# Patient Record
Sex: Male | Born: 2000 | Race: White | Hispanic: No | Marital: Single | Smoking: Current every day smoker
Health system: Southern US, Community
[De-identification: ages and names within clinical notes are randomized; demographics above are authoritative.]

## PROBLEM LIST (undated history)

## (undated) DIAGNOSIS — J45909 Unspecified asthma, uncomplicated: Secondary | ICD-10-CM

## (undated) DIAGNOSIS — F32A Depression, unspecified: Secondary | ICD-10-CM

## (undated) DIAGNOSIS — Z803 Family history of malignant neoplasm of breast: Secondary | ICD-10-CM

## (undated) DIAGNOSIS — F329 Major depressive disorder, single episode, unspecified: Secondary | ICD-10-CM

## (undated) DIAGNOSIS — R51 Headache: Secondary | ICD-10-CM

## (undated) DIAGNOSIS — N189 Chronic kidney disease, unspecified: Secondary | ICD-10-CM

## (undated) DIAGNOSIS — N02B9 Other recurrent and persistent immunoglobulin A nephropathy: Secondary | ICD-10-CM

## (undated) DIAGNOSIS — K589 Irritable bowel syndrome without diarrhea: Secondary | ICD-10-CM

## (undated) DIAGNOSIS — N049 Nephrotic syndrome with unspecified morphologic changes: Secondary | ICD-10-CM

## (undated) HISTORY — DX: Unspecified asthma, uncomplicated: J45.909

## (undated) HISTORY — DX: Headache: R51

## (undated) HISTORY — DX: Chronic kidney disease, unspecified: N18.9

## (undated) HISTORY — PX: CIRCUMCISION: SHX1350

## (undated) HISTORY — DX: Depression, unspecified: F32.A

## (undated) HISTORY — DX: Family history of malignant neoplasm of breast: Z80.3

## (undated) HISTORY — DX: Other recurrent and persistent immunoglobulin A nephropathy: N02.B9

## (undated) HISTORY — DX: Nephrotic syndrome with unspecified morphologic changes: N04.9

## (undated) HISTORY — DX: Irritable bowel syndrome, unspecified: K58.9

## (undated) HISTORY — PX: COLONOSCOPY WITH ESOPHAGOGASTRODUODENOSCOPY (EGD): SHX5779

---
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
## 2021-02-05 NOTE — Progress Notes (Signed)
Complete Physical  Assessment and Plan: Health Maintenance- Discussed STD testing, safe sex, alcohol and drug awareness, drinking and driving dangers, wearing a seat belt and general safety measures for young adult. Clinton Jones was seen today for annual exam.  Diagnoses and all orders for this visit:  Encounter for general adult medical examination with abnormal findings Due Yearly  IgA nephropathy -     CBC with Differential/Platelet -     COMPLETE METABOLIC PANEL WITH GFR Continue to follow with nephrology  Bulimia nervosa Continue to follow with psychiatry Discussed diet and need for MVI and protein States he is currently eating breakfast and dinner, has not been binging and purging  Major depression, chronic -     buPROPion (WELLBUTRIN XL) 150 MG 24 hr tablet; Take 1 tablet (150 mg total) by mouth every morning. Instructed patient to contact office or on-call physician promptly should condition worsen or any new symptoms appear. IF THE PATIENT HAS ANY SUICIDAL OR HOMICIDAL IDEATIONS, CALL THE OFFICE, DISCUSS WITH A SUPPORT MEMBER, OR GO TO THE ER IMMEDIATELY. Patient was agreeable with this plan.  Continue to follow with psychiatry  Migraine without aura and without status migrainosus, not intractable Currently well controlled  Nicotine dependence due to vaping tobacco product Counseled on the dangers of vaping and stressed the importance of quitting.  He is not currently ready to quit.   Screening for hematuria or proteinuria -     Urinalysis, Routine w reflex microscopic -     Microalbumin / creatinine urine ratio  Screening for thyroid disorder -     TSH  Screening, lipid -     Lipid panel  Screening for diabetes mellitus -     Hemoglobin A1c  Vitamin D deficiency -     VITAMIN D 25 Hydroxy (Vit-D Deficiency, Fractures)  Medication management -     Magnesium  Screening for STD (sexually transmitted disease) -     C. trachomatis/N. gonorrhoeae RNA -     HIV  Antibody (routine testing w rflx) -     RPR  Flu vaccine need -     Flu Vaccine QUAD 6+ mos PF IM (Fluarix Quad PF)     Discussed med's effects and SE's. Screening labs and tests as requested with regular follow-up as recommended. Over 40 minutes of exam, counseling, chart review and critical decision making was performed   HPI  This very nice 20 y.o.male presents for complete physical.  Patient has no major health issues.  Patient reports no complaints at this time.  BMI is Body mass index is 17.88 kg/m., he has been working on diet and exercise. Wt Readings from Last 3 Encounters:  02/10/21 115 lb (52.2 kg) (2 %, Z= -2.11)*  11/07/20 118 lb 6.4 oz (53.7 kg) (3 %, Z= -1.83)*  07/10/19 115 lb 11.9 oz (52.5 kg) (5 %, Z= -1.69)*   * Growth percentiles are based on CDC (Boys, 2-20 Years) data.   He does note that his depression does seem to be slightly worse currently. Currently on Zoloft and symptoms are not controlled.  Has tried Prozac and Lexapro in the past. He used Lamictal in the past with no results. He does not find joy in things, states he is having suicidal thoughts but does not have a plan. He has an upcoming appointment with psychiatry on 02/20/21.  He does not workout.  Finally, patient has history of Vitamin D Deficiency and last vitamin D was No results found for: VD25OH.  Currently  on supplementation  Current Medications:  Current Outpatient Medications on File Prior to Visit  Medication Sig Dispense Refill   Ascorbic Acid (VITAMIN C) 1000 MG tablet Take 1,000 mg by mouth daily.     FIBER ADULT GUMMIES PO Take by mouth. Takes 2 a day     imipramine (TOFRANIL) 25 MG tablet Take 25 mg by mouth at bedtime. Takes 25 mg BID     Omega-3 Fatty Acids (FISH OIL EXTRA STRENGTH) 1200 MG CAPS Take by mouth.     No current facility-administered medications on file prior to visit.   Health Maintenance:   Immunization History  Administered Date(s) Administered   DTaP  11/17/2001, 01/10/2002, 03/14/2002, 12/25/2002, 09/23/2005   Hepatitis A 11/06/2004, 09/23/2005   Hepatitis B June 16, 2001, 10/17/2001, 06/19/2002   HiB (PRP-OMP) 11/17/2001, 01/10/2002, 03/14/2002, 09/13/2002   IPV 11/17/2001, 01/10/2002, 12/25/2002, 09/23/2005   Influenza-Unspecified 11/30/2011, 01/03/2014, 12/09/2018   MMR 09/13/2002, 09/23/2005   MenQuadfi_Meningococcal Groups ACYW Conjugate 07/18/2013, 08/05/2018   Meningococcal B, OMV 08/05/2018, 10/30/2020   Pneumococcal-Unspecified 11/17/2001, 01/10/2002, 03/14/2002, 11/22/2002   Tdap 05/20/2012   Varicella 12/25/2002, 09/23/2005    TD/TDAP: Influenza:02/10/21 Pneumovax:  HPV vaccines:  Sexually Active: yes STD testing offered Last Dental Exam: Last Eye Exam:  Allergies:  Allergies  Allergen Reactions   Morphine And Related Hives, Nausea And Vomiting and Rash   Medical History:  has Migraine with aura, without mention of intractable migraine without mention of status migrainosus; Tension headache; Migraine without aura and without status migrainosus, not intractable; Mild recurrent major depression ([place]); Cannabis use disorder, moderate, dependence ([place]); Nicotine dependence due to vaping tobacco product; Abdominal pain, chronic, generalized; Abnormal weight loss; IgA nephropathy; Irritable bowel syndrome with constipation and diarrhea; Recurrent oral ulcers; and Bulimia nervosa on their problem list. Surgical History:  He  has a past surgical history that includes Tympanostomy (Bilateral, 2004); Adenoidectomy (Bilateral, 2004); and Circumcision (2003). Family History:  Hisfamily history includes Allergic rhinitis in his brother, father, and mother; Asthma in his brother; Depression in his father and paternal grandfather; Heart attack in his maternal grandfather and maternal grandmother; Hypertension in his father, maternal grandfather, and maternal grandmother; Migraines in his mother; Nephrotic syndrome in his father;  Pancreatic cancer in his maternal grandfather. Social History:   reports that he has been smoking e-cigarettes. He started smoking about 4 years ago. He uses smokeless tobacco. He reports current drug use. Drug: Marijuana. He reports that he does not drink alcohol.  Review of Systems: Review of Systems  Constitutional:  Negative for chills and fever.  HENT:  Negative for congestion, hearing loss, sinus pain, sore throat and tinnitus.   Eyes:  Negative for blurred vision and double vision.  Respiratory:  Negative for cough, hemoptysis, sputum production, shortness of breath and wheezing.   Cardiovascular:  Negative for chest pain, palpitations and leg swelling.  Gastrointestinal:  Negative for abdominal pain, constipation, diarrhea, heartburn, nausea and vomiting.  Genitourinary:  Negative for dysuria, frequency, hematuria and urgency.  Musculoskeletal:  Negative for back pain, falls, joint pain, myalgias and neck pain.  Skin:  Negative for rash.  Neurological:  Negative for dizziness, tingling, tremors, weakness and headaches.  Endo/Heme/Allergies:  Does not bruise/bleed easily.  Psychiatric/Behavioral:  Positive for depression and suicidal ideas. The patient is not nervous/anxious and does not have insomnia.    Physical Exam: Estimated body mass index is 18.54 kg/m as calculated from the following:   Height as of 11/21/19: $RemoveBef'5\' 7"'RcZOLssWpL$  (1.702 m).   Weight as of 11/07/20:  118 lb 6.4 oz (53.7 kg). There were no vitals taken for this visit. General Appearance: very pleasant thin male, in no apparent distress.  Eyes: PERRLA, EOMs, conjunctiva no swelling or erythema, normal fundi and vessels.  Sinuses: No Frontal/maxillary tenderness  ENT/Mouth: Ext aud canals clear, normal light reflex with TMs without erythema, bulging. Good dentition. No erythema, swelling, or exudate on post pharynx. Tonsils not swollen or erythematous. Hearing normal.  Neck: Supple, thyroid normal. No bruits  Respiratory:  Respiratory effort normal, BS equal bilaterally without rales, rhonchi, wheezing or stridor.  Cardio: RRR without murmurs, rubs or gallops. Brisk peripheral pulses without edema.  Chest: symmetric, with normal excursions and percussion.  Abdomen: Soft, nontender, no guarding, rebound, hernias, masses, or organomegaly.  Lymphatics: Non tender without lymphadenopathy.  Genitourinary: defer Musculoskeletal: Full ROM all peripheral extremities,5/5 strength, and normal gait.  Skin: Warm, dry without rashes, lesions, ecchymosis. Neuro: Cranial nerves intact, reflexes equal bilaterally. Normal muscle tone, no cerebellar symptoms. Sensation intact.  Psych: Awake and oriented X 3, normal affect, Insight and Judgment appropriate.   EKG: defer  Clinton Jones 12:36 PM [facility]

## 2021-02-10 ENCOUNTER — Ambulatory Visit (INDEPENDENT_AMBULATORY_CARE_PROVIDER_SITE_OTHER): Payer: BC Managed Care – PPO | Admitting: Nurse Practitioner

## 2021-02-10 ENCOUNTER — Encounter: Payer: Self-pay | Admitting: Nurse Practitioner

## 2021-02-10 ENCOUNTER — Other Ambulatory Visit: Payer: Self-pay

## 2021-02-10 VITALS — BP 98/66 | HR 99 | Temp 97.7°F | Ht 67.25 in | Wt 115.0 lb

## 2021-02-10 DIAGNOSIS — N028 Recurrent and persistent hematuria with other morphologic changes: Secondary | ICD-10-CM

## 2021-02-10 DIAGNOSIS — Z0000 Encounter for general adult medical examination without abnormal findings: Secondary | ICD-10-CM | POA: Diagnosis not present

## 2021-02-10 DIAGNOSIS — Z1389 Encounter for screening for other disorder: Secondary | ICD-10-CM

## 2021-02-10 DIAGNOSIS — G43009 Migraine without aura, not intractable, without status migrainosus: Secondary | ICD-10-CM

## 2021-02-10 DIAGNOSIS — Z131 Encounter for screening for diabetes mellitus: Secondary | ICD-10-CM

## 2021-02-10 DIAGNOSIS — Z113 Encounter for screening for infections with a predominantly sexual mode of transmission: Secondary | ICD-10-CM | POA: Diagnosis not present

## 2021-02-10 DIAGNOSIS — Z23 Encounter for immunization: Secondary | ICD-10-CM | POA: Diagnosis not present

## 2021-02-10 DIAGNOSIS — Z13220 Encounter for screening for lipoid disorders: Secondary | ICD-10-CM

## 2021-02-10 DIAGNOSIS — F17290 Nicotine dependence, other tobacco product, uncomplicated: Secondary | ICD-10-CM

## 2021-02-10 DIAGNOSIS — Z0001 Encounter for general adult medical examination with abnormal findings: Secondary | ICD-10-CM

## 2021-02-10 DIAGNOSIS — F502 Bulimia nervosa: Secondary | ICD-10-CM

## 2021-02-10 DIAGNOSIS — E559 Vitamin D deficiency, unspecified: Secondary | ICD-10-CM

## 2021-02-10 DIAGNOSIS — F330 Major depressive disorder, recurrent, mild: Secondary | ICD-10-CM

## 2021-02-10 DIAGNOSIS — Z1329 Encounter for screening for other suspected endocrine disorder: Secondary | ICD-10-CM

## 2021-02-10 DIAGNOSIS — Z79899 Other long term (current) drug therapy: Secondary | ICD-10-CM

## 2021-02-10 DIAGNOSIS — F329 Major depressive disorder, single episode, unspecified: Secondary | ICD-10-CM

## 2021-02-10 MED ORDER — BUPROPION HCL ER (XL) 150 MG PO TB24: 150.0000 mg | ORAL_TABLET | ORAL | 2 refills | Status: DC

## 2021-02-10 NOTE — Patient Instructions (Addendum)
Bupropion Extended-Release Tablets (Depression/Mood Disorders) What is this medication? BUPROPION (byoo PROE pee on) treats depression. It increases norepinephrine and dopamine in the brain, hormones that help regulate mood. It belongs to a group of medications called NDRIs. This medicine may be used for other purposes; ask your health care provider or pharmacist if you have questions. COMMON BRAND NAME(S): Aplenzin, Budeprion XL, Forfivo XL, Wellbutrin XL What should I tell my care team before I take this medication? They need to know if you have any of these conditions: An eating disorder, such as anorexia or bulimia Bipolar disorder or psychosis Diabetes or high blood sugar, treated with medication Glaucoma Head injury or brain tumor Heart disease, previous heart attack, or irregular heart beat High blood pressure Kidney or liver disease Seizures (convulsions) Suicidal thoughts or a previous suicide attempt Tourette's syndrome Weight loss An unusual or allergic reaction to bupropion, other medications, foods, dyes, or preservatives Pregnant or trying to become pregnant Breast-feeding How should I use this medication? Take this medication by mouth with a glass of water. Follow the directions on the prescription label. You can take it with or without food. If it upsets your stomach, take it with food. Do not crush, chew, or cut these tablets. This medication is taken once daily at the same time each day. Do not take your medication more often than directed. Do not stop taking this medication suddenly except upon the advice of your care team. Stopping this medication too quickly may cause serious side effects or your condition may worsen. A special MedGuide will be given to you by the pharmacist with each prescription and refill. Be sure to read this information carefully each time. Talk to your care team about the use of this medication in children. Special care may be needed. Overdosage:  If you think you have taken too much of this medicine contact a poison control center or emergency room at once. NOTE: This medicine is only for you. Do not share this medicine with others. What if I miss a dose? If you miss a dose, skip the missed dose and take your next tablet at the regular time. Do not take double or extra doses. What may interact with this medication? Do not take this medication with any of the following: Linezolid MAOIs like Azilect, Carbex, Eldepryl, Marplan, Nardil, and Parnate Methylene blue (injected into a vein) Other medications that contain bupropion like Zyban This medication may also interact with the following: Alcohol Certain medications for anxiety or sleep Certain medications for blood pressure like metoprolol, propranolol Certain medications for depression or psychotic disturbances Certain medications for HIV or AIDS like efavirenz, lopinavir, nelfinavir, ritonavir Certain medications for irregular heart beat like propafenone, flecainide Certain medications for Parkinson's disease like amantadine, levodopa Certain medications for seizures like carbamazepine, phenytoin, phenobarbital Cimetidine Clopidogrel Cyclophosphamide Digoxin Furazolidone Isoniazid Nicotine Orphenadrine Procarbazine Steroid medications like prednisone or cortisone Stimulant medications for attention disorders, weight loss, or to stay awake Tamoxifen Theophylline Thiotepa Ticlopidine Tramadol Warfarin This list may not describe all possible interactions. Give your health care provider a list of all the medicines, herbs, non-prescription drugs, or dietary supplements you use. Also tell them if you smoke, drink alcohol, or use illegal drugs. Some items may interact with your medicine. What should I watch for while using this medication? Tell your care team if your symptoms do not get better or if they get worse. Visit your care team for regular checks on your progress.  Because it may take several weeks  to see the full effects of this medication, it is important to continue your treatment as prescribed. Watch for new or worsening thoughts of suicide or depression. This includes sudden changes in mood, behavior, or thoughts. These changes can happen at any time but are more common in the beginning of treatment or after a change in dose. Call your care team right away if you experience these thoughts or worsening depression. Manic episodes may happen in patients with bipolar disorder who take this medication. Watch for changes in feelings or behaviors such as feeling anxious, nervous, agitated, panicky, irritable, hostile, aggressive, impulsive, severely restless, overly excited and hyperactive, or trouble sleeping. These symptoms can happen at anytime but are more common in the beginning of treatment or after a change in dose. Call your care team right away if you notice any of these symptoms. This medication may cause serious skin reactions. They can happen weeks to months after starting the medication. Contact your care team right away if you notice fevers or flu-like symptoms with a rash. The rash may be red or purple and then turn into blisters or peeling of the skin. Or, you might notice a red rash with swelling of the face, lips or lymph nodes in your neck or under your arms. Avoid drinks that contain alcohol while taking this medication. Drinking large amounts of alcohol, using sleeping or anxiety medications, or quickly stopping the use of these agents while taking this medication may increase your risk for a seizure. Do not drive or use heavy machinery until you know how this medication affects you. This medication can impair your ability to perform these tasks. Do not take this medication close to bedtime. It may prevent you from sleeping. Your mouth may get dry. Chewing sugarless gum or sucking hard candy, and drinking plenty of water may help. Contact your care  team if the problem does not go away or is severe. The tablet shell for some brands of this medication does not dissolve. This is normal. The tablet shell may appear whole in the stool. This is not a cause for concern. What side effects may I notice from receiving this medication? Side effects that you should report to your care team as soon as possible: Allergic reactions--skin rash, itching, hives, swelling of the face, lips, tongue, or throat Increase in blood pressure Mood and behavior changes--anxiety, nervousness, confusion, hallucinations, irritability, hostility, thoughts of suicide or self-harm, worsening mood, feelings of depression Redness, blistering, peeling, or loosening of the skin, including inside the mouth Seizures Sudden eye pain or change in vision such as blurry vision, seeing halos around lights, vision loss Side effects that usually do not require medical attention (report to your care team if they continue or are bothersome): Constipation Dizziness Dry mouth Loss of appetite Nausea Tremors or shaking Trouble sleeping This list may not describe all possible side effects. Call your doctor for medical advice about side effects. You may report side effects to FDA at 1-800-FDA-1088. Where should I keep my medication? Keep out of the reach of children and pets. Store at room temperature between 15 and 30 degrees C (59 and 86 degrees F). Throw away any unused medication after the expiration date. NOTE: This sheet is a summary. It may not cover all possible information. If you have questions about this medicine, talk to your doctor, pharmacist, or health care provider.  2022 Elsevier/Gold Standard (2020-04-24 00:00:00)  Major Depressive Disorder, Adult Major depressive disorder (MDD) is a mental health condition. It  may also be called clinical depression or unipolar depression. MDD causes symptoms of sadness, hopelessness, and loss of interest in things. These symptoms last  most of the day, almost every day, for 2 weeks. MDD can also cause physical symptoms. It can interfere with relationships and with everyday activities, such as work, school, and activities that are usually pleasant. MDD may be mild, moderate, or severe. It may be single-episode MDD, which happens once, or recurrent MDD, which may occur multiple times. What are the causes? The exact cause of this condition is not known. MDD is most likely caused by a combination of things, which may include: Your personality traits. Learned or conditioned behaviors or thoughts or feelings that reinforce negativity. Any alcohol or substance misuse. Long-term (chronic) physical or mental health illness. Going through a traumatic experience or major life changes. What increases the risk? The following factors may make someone more likely to develop MDD: A family history of depression. Being a woman. Troubled family relationships. Abnormally low levels of certain brain chemicals. Traumatic or painful events in childhood, especially abuse or loss of a parent. A lot of stress from life experiences, such as poor living conditions or discrimination. Chronic physical illness or other mental health disorders. What are the signs or symptoms? The main symptoms of MDD usually include: Constant depressed or irritable mood. A loss of interest in things and activities. Other symptoms include: Sleeping or eating too much or too little. Unexplained weight gain or weight loss. Tiredness or low energy. Being agitated, restless, or weak. Feeling hopeless, worthless, or guilty. Trouble thinking clearly or making decisions. Thoughts of suicide or thoughts of harming others. Isolating oneself or avoiding other people or activities. Trouble completing tasks, work, or any normal obligations. Severe symptoms of this condition may include: Psychotic depression.This may include false beliefs, or delusions. It may also include  seeing, hearing, tasting, smelling, or feeling things that are not real (hallucinations). Chronic depression or persistent depressive disorder. This is low-level depression that lasts for at least 2 years. Melancholic depression, or feeling extremely sad and hopeless. Catatonic depression, which includes trouble speaking and trouble moving. How is this diagnosed? This condition may be diagnosed based on: Your symptoms. Your medical and mental health history. You may be asked questions about your lifestyle, including any drug and alcohol use. A physical exam. Blood tests to rule out other conditions. MDD is confirmed if you have the following symptoms most of the day, nearly every day, in a 2-week period: Either a depressed mood or loss of interest. At least four other MDD symptoms. How is this treated? This condition is usually treated by mental health professionals, such as psychologists, psychiatrists, and clinical social workers. You may need more than one type of treatment. Treatment may include: Psychotherapy, also called talk therapy or counseling. Types of psychotherapy include: Cognitive behavioral therapy (CBT). This teaches you to recognize unhealthy feelings, thoughts, and behaviors, and replace them with positive thoughts and actions. Interpersonal therapy (IPT). This helps you to improve the way you communicate with others or relate to them. Family therapy. This treatment includes members of your family. Medicines to treat anxiety and depression. These medicines help to balance the brain chemicals that affect your emotions. Lifestyle changes. You may be asked to: Limit alcohol use and avoid drug use. Get regular exercise. Get plenty of sleep. Make healthy eating choices. Spend more time outdoors. Brain stimulation. This may be done if symptoms are very severe and other treatments have not worked. Examples  of this treatment are electroconvulsive therapy and transcranial magnetic  stimulation. Follow these instructions at home: Activity Exercise regularly and spend time outdoors. Find activities that you enjoy doing, and make time to do them. Find healthy ways to manage stress, such as: Meditation or deep breathing. Spending time in nature. Journaling. Return to your normal activities as told by your health care provider. Ask your health care provider what activities are safe for you. Alcohol and drug use If you drink alcohol: Limit how much you use to: 0-1 drink a day for women who are not pregnant. 0-2 drinks a day for men. Be aware of how much alcohol is in your drink. In the [place] one drink equals one 12 oz bottle of beer (355 mL), one 5 oz glass of wine (148 mL), or one 1 oz glass of hard liquor (44 mL). Discuss your alcohol use with your health care provider. Alcohol can affect any antidepressant medicines you are taking. Discuss any drug use with your health care provider. General instructions  Take over-the-counter and prescription medicines only as told by your health care provider. Eat a healthy diet and get plenty of sleep. Consider joining a support group. Your health care provider may be able to recommend one. Keep all follow-up visits as told by your health care provider. This is important. Where to find more information [employer] on Mental Illness: www.nami.org [place] [employer] of Mental Health: http://www.maynard.net/ Contact a health care provider if: Your symptoms get worse. You develop new symptoms. Get help right away if: You self-harm. You have serious thoughts about hurting yourself or others. You hallucinate. If you ever feel like you may hurt yourself or others, or have thoughts about taking your own life, get help right away. Go to your nearest emergency department or: Call your local emergency services (911 in the [place] Call a suicide crisis helpline, such as the National Suicide Prevention Lifeline at 626-204-4982 or  988 in the [place] This is open 24 hours a day in the [place] Text the Crisis Text Line at 325-713-1808 (in the [place] Summary Major depressive disorder (MDD) is a mental health condition. MDD causes symptoms of sadness, hopelessness, and loss of interest in things. These symptoms last most of the day, almost every day, for 2 weeks. The symptoms of MDD can interfere with relationships and with everyday activities. Treatments and support are available for people who develop MDD. You may need more than one type of treatment. Get help right away if you have serious thoughts about hurting yourself or others. This information is not intended to replace advice given to you by your health care provider. Make sure you discuss any questions you have with your health care provider. Document Revised: 09/04/2020 Document Reviewed: 01/21/2019 Elsevier Patient Education  2022 [employer].

## 2021-02-11 LAB — COMPLETE METABOLIC PANEL WITH GFR
AG Ratio: 1.8 (calc) (ref 1.0–2.5)
ALT: 7 U/L — ABNORMAL LOW (ref 8–46)
AST: 12 U/L (ref 12–32)
Albumin: 4.2 g/dL (ref 3.6–5.1)
Alkaline phosphatase (APISO): 132 U/L (ref 46–169)
BUN: 9 mg/dL (ref 7–20)
CO2: 30 mmol/L (ref 20–32)
Calcium: 9.4 mg/dL (ref 8.9–10.4)
Chloride: 102 mmol/L (ref 98–110)
Creat: 0.86 mg/dL (ref 0.60–1.24)
Globulin: 2.4 g/dL (calc) (ref 2.1–3.5)
Glucose, Bld: 80 mg/dL (ref 65–99)
Potassium: 4.5 mmol/L (ref 3.8–5.1)
Sodium: 139 mmol/L (ref 135–146)
Total Bilirubin: 0.4 mg/dL (ref 0.2–1.1)
Total Protein: 6.6 g/dL (ref 6.3–8.2)
eGFR: 128 mL/min/{1.73_m2} (ref >=60)

## 2021-02-11 LAB — HEMOGLOBIN A1C
Hgb A1c MFr Bld: 4.7 % of total Hgb (ref <5.7)
Mean Plasma Glucose: 88 mg/dL
eAG (mmol/L): 4.9 mmol/L

## 2021-02-11 LAB — MAGNESIUM: Magnesium: 2.1 mg/dL (ref 1.5–2.5)

## 2021-02-11 LAB — URINALYSIS, ROUTINE W REFLEX MICROSCOPIC
Bacteria, UA: NONE SEEN /HPF
Bilirubin Urine: NEGATIVE
Glucose, UA: NEGATIVE
Hyaline Cast: NONE SEEN /LPF
Ketones, ur: NEGATIVE
Nitrite: NEGATIVE
Specific Gravity, Urine: 1.014 (ref 1.001–1.035)
Squamous Epithelial / HPF: NONE SEEN /HPF (ref <=5)
pH: 5.5 (ref 5.0–8.0)

## 2021-02-11 LAB — MICROALBUMIN / CREATININE URINE RATIO
Creatinine, Urine: 114 mg/dL (ref 20–320)
Microalb Creat Ratio: 130 mcg/mg creat — ABNORMAL HIGH (ref <30)
Microalb, Ur: 14.8 mg/dL

## 2021-02-11 LAB — CBC WITH DIFFERENTIAL/PLATELET
Absolute Monocytes: 462 cells/uL (ref 200–950)
Basophils Absolute: 21 cells/uL (ref 0–200)
Basophils Relative: 0.3 %
Eosinophils Absolute: 91 cells/uL (ref 15–500)
Eosinophils Relative: 1.3 %
HCT: 41.5 % (ref 38.5–50.0)
Hemoglobin: 14.5 g/dL (ref 13.2–17.1)
Lymphs Abs: 1680 cells/uL (ref 850–3900)
MCH: 33.6 pg — ABNORMAL HIGH (ref 27.0–33.0)
MCHC: 34.9 g/dL (ref 32.0–36.0)
MCV: 96.1 fL (ref 80.0–100.0)
MPV: 11.2 fL (ref 7.5–12.5)
Monocytes Relative: 6.6 %
Neutro Abs: 4746 cells/uL (ref 1500–7800)
Neutrophils Relative %: 67.8 %
Platelets: 270 10*3/uL (ref 140–400)
RBC: 4.32 10*6/uL (ref 4.20–5.80)
RDW: 12.3 % (ref 11.0–15.0)
Total Lymphocyte: 24 %
WBC: 7 10*3/uL (ref 3.8–10.8)

## 2021-02-11 LAB — LIPID PANEL
Cholesterol: 131 mg/dL (ref <170)
HDL: 52 mg/dL (ref >45)
LDL Cholesterol (Calc): 64 mg/dL (calc) (ref <110)
Non-HDL Cholesterol (Calc): 79 mg/dL (calc) (ref <120)
Total CHOL/HDL Ratio: 2.5 (calc) (ref <5.0)
Triglycerides: 73 mg/dL (ref <90)

## 2021-02-11 LAB — MICROSCOPIC MESSAGE

## 2021-02-11 LAB — RPR: RPR Ser Ql: NONREACTIVE

## 2021-02-11 LAB — HIV ANTIBODY (ROUTINE TESTING W REFLEX): HIV 1&2 Ab, 4th Generation: NONREACTIVE

## 2021-02-11 LAB — VITAMIN D 25 HYDROXY (VIT D DEFICIENCY, FRACTURES): Vit D, 25-Hydroxy: 25 ng/mL — ABNORMAL LOW (ref 30–100)

## 2021-02-11 LAB — TSH: TSH: 1.42 mIU/L (ref 0.50–4.30)

## 2021-02-13 ENCOUNTER — Other Ambulatory Visit: Payer: Self-pay | Admitting: Nurse Practitioner

## 2021-02-13 DIAGNOSIS — Z113 Encounter for screening for infections with a predominantly sexual mode of transmission: Secondary | ICD-10-CM

## 2021-02-18 ENCOUNTER — Other Ambulatory Visit: Payer: Self-pay

## 2021-02-18 ENCOUNTER — Other Ambulatory Visit: Payer: BC Managed Care – PPO

## 2021-02-18 DIAGNOSIS — Z113 Encounter for screening for infections with a predominantly sexual mode of transmission: Secondary | ICD-10-CM | POA: Diagnosis not present

## 2021-02-19 LAB — C. TRACHOMATIS/N. GONORRHOEAE RNA
C. trachomatis RNA, TMA: NOT DETECTED
N. gonorrhoeae RNA, TMA: NOT DETECTED

## 2021-03-10 ENCOUNTER — Encounter: Payer: Self-pay | Admitting: Nurse Practitioner

## 2021-03-10 ENCOUNTER — Other Ambulatory Visit: Payer: Self-pay | Admitting: Nurse Practitioner

## 2021-03-10 DIAGNOSIS — M25519 Pain in unspecified shoulder: Secondary | ICD-10-CM

## 2021-03-10 DIAGNOSIS — M25512 Pain in left shoulder: Secondary | ICD-10-CM

## 2021-03-10 MED ORDER — MELOXICAM 7.5 MG PO TABS: 7.5000 mg | ORAL_TABLET | Freq: Every day | ORAL | 2 refills | Status: DC

## 2021-03-11 ENCOUNTER — Ambulatory Visit
Admission: RE | Admit: 2021-03-11 | Discharge: 2021-03-11 | Disposition: A | Discharge status: Home / Self Care | Payer: Self-pay | Source: Ambulatory Visit | Attending: Nurse Practitioner | Admitting: Nurse Practitioner

## 2021-03-11 ENCOUNTER — Other Ambulatory Visit: Payer: Self-pay

## 2021-03-11 DIAGNOSIS — M25512 Pain in left shoulder: Secondary | ICD-10-CM

## 2021-03-20 ENCOUNTER — Other Ambulatory Visit: Payer: Self-pay | Admitting: Nurse Practitioner

## 2021-03-20 DIAGNOSIS — M25512 Pain in left shoulder: Secondary | ICD-10-CM

## 2021-03-23 ENCOUNTER — Encounter: Payer: Self-pay | Admitting: Nurse Practitioner

## 2021-04-01 ENCOUNTER — Encounter: Payer: Self-pay | Admitting: Nurse Practitioner

## 2021-04-01 ENCOUNTER — Ambulatory Visit (INDEPENDENT_AMBULATORY_CARE_PROVIDER_SITE_OTHER): Payer: BC Managed Care – PPO | Admitting: Nurse Practitioner

## 2021-04-01 ENCOUNTER — Other Ambulatory Visit: Payer: Self-pay

## 2021-04-01 VITALS — BP 98/62 | HR 133 | Temp 97.7°F | Wt 119.0 lb

## 2021-04-01 DIAGNOSIS — F17290 Nicotine dependence, other tobacco product, uncomplicated: Secondary | ICD-10-CM | POA: Diagnosis not present

## 2021-04-01 DIAGNOSIS — R300 Dysuria: Secondary | ICD-10-CM

## 2021-04-01 DIAGNOSIS — F329 Major depressive disorder, single episode, unspecified: Secondary | ICD-10-CM

## 2021-04-01 DIAGNOSIS — R369 Urethral discharge, unspecified: Secondary | ICD-10-CM | POA: Diagnosis not present

## 2021-04-01 MED ORDER — CIPROFLOXACIN HCL 500 MG PO TABS
500.0000 mg | ORAL_TABLET | Freq: Two times a day (BID) | ORAL | 0 refills | Status: AC
Start: 2021-04-01 — End: 2021-04-11

## 2021-04-01 NOTE — Progress Notes (Signed)
Assessment and Plan:  Clinton Jones was seen today for acute visit.  Diagnoses and all orders for this visit:  Dysuria -     ciprofloxacin (CIPRO) 500 MG tablet; Take 1 tablet (500 mg total) by mouth 2 (two) times daily for 10 days. -     C. trachomatis/N. gonorrhoeae RNA -     Urine Culture -     Urinalysis, Routine w reflex microscopic Push fluids If develop nausea, vomiting, fever or severe back pain go to the ER  Penile discharge -     ciprofloxacin (CIPRO) 500 MG tablet; Take 1 tablet (500 mg total) by mouth 2 (two) times daily for 10 days. -     C. trachomatis/N. 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 Discussed med's effects and SE's.   Over 20 minutes of exam, counseling, chart review, and critical decision making was performed.   Future Appointments  Date Time Provider Department Center  04/01/2021  4:15 PM Revonda Humphrey, NP [facility] None  02/10/2022  3:00 PM Revonda Humphrey, NP [facility] None    ------------------------------------------------------------------------------------------------------------------   HPI BP 98/62    Pulse (!) 133    Temp 97.7 F (36.5 C)    Wt 119 lb (54 kg)    SpO2 97%    BMI 18.50 kg/m   20 y.o.male presents for burning on urination and yellow penile discharge which began 2 days ago. No new sexual partner. Same sex partner is asymptomatic.   BMI is Body mass index is 18.5 kg/m., he has been working on diet and exercise. Wt Readings from Last 3 Encounters:  04/01/21 119 lb (54 kg) (3 %, Z= -1.85)*  02/10/21 115 lb (52.2 kg) (2 %, Z= -2.11)*  11/07/20 118 lb 6.4 oz (53.7 kg) (3 %, Z= -1.83)*   * Growth percentiles are based on CDC (Boys, 2-20 Years) data.   He is on a new medication for  depression- Zyprexa 7.5mg . He started this medication approximately 1 month ago.  He has stopped vaping, not having withdrawal symptoms  Past Medical History:  Diagnosis Date   Asthma    Chronic kidney disease    Depression    Headache(784.0)    IBS (irritable bowel syndrome)    IgA nephropathy with nephrotic syndrome      Allergies  Allergen Reactions   Morphine And Related Hives, Nausea And Vomiting and Rash    Current Outpatient Medications on File Prior to Visit  Medication Sig   FIBER ADULT GUMMIES PO Take by mouth. Takes 2 a day   imipramine (TOFRANIL) 25 MG tablet Take 25 mg by mouth at bedtime. Takes 2 tablet once daily   sertraline (ZOLOFT) 100 MG tablet Take 100 mg by mouth daily.   Ascorbic Acid (VITAMIN C) 1000 MG tablet Take 1,000 mg by mouth daily. (Patient not taking: Reported on 02/10/2021)   buPROPion (WELLBUTRIN XL) 150 MG 24 hr tablet Take 1 tablet (150 mg total) by mouth every morning. (Patient not taking: Reported on 04/01/2021)   meloxicam (MOBIC) 7.5 MG tablet Take 1 tablet (7.5 mg total) by mouth daily. (Patient not taking: Reported on 04/01/2021)   Omega-3 Fatty Acids (FISH OIL EXTRA STRENGTH) 1200 MG CAPS Take by mouth. (Patient not taking: Reported on 02/10/2021)  No current facility-administered medications on file prior to visit.    ROS: all negative except above.   Physical Exam:  BP 98/62    Pulse (!) 133    Temp 97.7 F (36.5 C)    Wt 119 lb (54 kg)    SpO2 97%    BMI 18.50 kg/m   General Appearance: Well nourished, in no apparent distress. Eyes: PERRLA, EOMs, conjunctiva no swelling or erythema Sinuses: No Frontal/maxillary tenderness ENT/Mouth: Ext aud canals clear, TMs without erythema, bulging. No erythema, swelling, or exudate on post pharynx.  Tonsils not swollen or erythematous. Hearing normal.  Neck: Supple, thyroid normal.  Respiratory: Respiratory effort normal, BS equal bilaterally without rales, rhonchi, wheezing or stridor.   Cardio: RRR with no MRGs. Brisk peripheral pulses without edema.  Abdomen: Soft, + BS.  Non tender, no guarding, rebound, hernias, masses. Lymphatics: Non tender without lymphadenopathy.  Musculoskeletal: Full ROM, 5/5 strength, normal gait.  Skin: Warm, dry without rashes, lesions, ecchymosis.  Neuro: Cranial nerves intact. Normal muscle tone, no cerebellar symptoms. Sensation intact.  Psych: Awake and oriented X 3, normal affect, Insight and Judgment appropriate.     Revonda Humphrey, NP 4:09 PM [facility]

## 2021-04-02 LAB — URINALYSIS, ROUTINE W REFLEX MICROSCOPIC
Bacteria, UA: NONE SEEN /HPF
Bilirubin Urine: NEGATIVE
Glucose, UA: NEGATIVE
Hyaline Cast: NONE SEEN /LPF
Nitrite: NEGATIVE
Specific Gravity, Urine: 1.025 (ref 1.001–1.035)
Squamous Epithelial / HPF: NONE SEEN /HPF (ref <=5)
WBC, UA: >=60 /HPF — AB (ref 0–5)
pH: 5.5 (ref 5.0–8.0)

## 2021-04-02 LAB — URINE CULTURE
MICRO NUMBER:: 12975597
Result:: NO GROWTH
SPECIMEN QUALITY:: ADEQUATE

## 2021-04-02 LAB — C. TRACHOMATIS/N. GONORRHOEAE RNA
C. trachomatis RNA, TMA: NOT DETECTED
N. gonorrhoeae RNA, TMA: DETECTED — AB

## 2021-04-02 LAB — MICROSCOPIC MESSAGE

## 2021-04-09 ENCOUNTER — Telehealth: Payer: Self-pay

## 2021-04-09 NOTE — Telephone Encounter (Signed)
Called patient 04/07/21 and made him aware he and his partner need to be treated with Rocephin 500mg . His partner doesn't have insurance so [facility]. Clinton Jones was going decide if he wanted to go with him and be treated there with his partner. I called 04/08/21 and left him a message to let me know what he decided to do for the treatment.

## 2021-04-10 ENCOUNTER — Ambulatory Visit: Payer: BC Managed Care – PPO

## 2021-04-11 ENCOUNTER — Other Ambulatory Visit: Payer: Self-pay

## 2021-04-11 ENCOUNTER — Ambulatory Visit (INDEPENDENT_AMBULATORY_CARE_PROVIDER_SITE_OTHER): Payer: BC Managed Care – PPO

## 2021-04-11 DIAGNOSIS — A64 Unspecified sexually transmitted disease: Secondary | ICD-10-CM | POA: Diagnosis not present

## 2021-04-11 MED ORDER — CEFTRIAXONE SODIUM 500 MG IJ SOLR
500.0000 mg | Freq: Once | INTRAMUSCULAR | Status: AC
  Administered 2021-04-11: 500 mg via INTRAMUSCULAR

## 2021-04-11 NOTE — Progress Notes (Signed)
Patient presents to the office for a nurse visit. Ceftriaxone, 500mg /mL administered in the right upper outer quadrant without any complications.

## 2021-05-12 ENCOUNTER — Encounter: Payer: Self-pay | Admitting: Nurse Practitioner

## 2021-10-22 ENCOUNTER — Encounter: Payer: Self-pay | Admitting: Nurse Practitioner

## 2021-10-22 NOTE — Progress Notes (Signed)
Assessment and Plan:  Clinton Jones was seen today for nausea.  Diagnoses and all orders for this visit:  Nausea and vomiting in adult Continue to monitor for any foods or stress that may be precipitating If no relief with current treatment plan or symptoms worsen notify the office and will refer to GI -     ondansetron (ZOFRAN-ODT) 4 MG disintegrating tablet; Take 1 tablet (4 mg total) by mouth every 8 (eight) hours as needed for nausea or vomiting.  Acute superficial gastritis without hemorrhage Continue food modifications- avoid spicy foods If no relief with current treatment plan or symptoms worsen notify the office and will refer to GI -     sucralfate (CARAFATE) 1 g tablet; Take 1 tablet (1 g total) by mouth 4 (four) times daily. -     esomeprazole (NEXIUM) 40 MG capsule; Take 1 capsule (40 mg total) by mouth 2 (two) times daily before a meal for 28 days.       Further disposition pending results of labs. Discussed med's effects and SE's.   Over 30 minutes of exam, counseling, chart review, and critical decision making was performed.   Future Appointments  Date Time Provider Department Center  02/10/2022  3:00 PM Raynelle Dick, NP [facility] None    ------------------------------------------------------------------------------------------------------------------   HPI BP 118/80   Pulse 98   Temp 97.9 F (36.6 C)   Resp 17   Ht 5' 7.25" (1.708 m)   Wt 166 lb 12.8 oz (75.7 kg)   SpO2 99%   BMI 25.93 kg/m   21 y.o.male presents for nausea/vomiting and diarrhea in the mornings.  It is intermittent and can not relate to particular foods or stress. The vomiting is more prominent than the diarrhea.     BP Readings from Last 3 Encounters:  10/24/21 118/80  04/01/21 98/62  02/10/21 98/66    BMI is Body mass index is 25.93 kg/m., he has been working on diet and exercise. Wt Readings from Last 3 Encounters:  10/24/21 166 lb 12.8 oz (75.7 kg)  04/01/21 119 lb (54 kg) (3  %, Z= -1.85)*  02/10/21 115 lb (52.2 kg) (2 %, Z= -2.11)*   * Growth percentiles are based on CDC (Boys, 2-20 Years) data.     Past Medical History:  Diagnosis Date   Asthma    Chronic kidney disease    Depression    Headache(784.0)    IBS (irritable bowel syndrome)    IgA nephropathy with nephrotic syndrome      Allergies  Allergen Reactions   Morphine And Related Hives, Nausea And Vomiting and Rash    Current Outpatient Medications on File Prior to Visit  Medication Sig   FIBER ADULT GUMMIES PO Take by mouth. Takes 2 a day   imipramine (TOFRANIL) 25 MG tablet Take 25 mg by mouth at bedtime. Takes 2 tablet once daily   OLANZapine (ZYPREXA) 7.5 MG tablet Take 7.5 mg by mouth at bedtime.   sertraline (ZOLOFT) 100 MG tablet Take 100 mg by mouth daily.   No current facility-administered medications on file prior to visit.    ROS: all negative except above.   Physical Exam:  BP 118/80   Pulse 98   Temp 97.9 F (36.6 C)   Resp 17   Ht 5' 7.25" (1.708 m)   Wt 166 lb 12.8 oz (75.7 kg)   SpO2 99%   BMI 25.93 kg/m   General Appearance: Well nourished, in no apparent distress. Eyes: PERRLA, EOMs, conjunctiva  no swelling or erythema Sinuses: No Frontal/maxillary tenderness ENT/Mouth: Ext aud canals clear, TMs without erythema, bulging. No erythema, swelling, or exudate on post pharynx.  Tonsils not swollen or erythematous. Hearing normal.  Neck: Supple, thyroid normal.  Respiratory: Respiratory effort normal, BS equal bilaterally without rales, rhonchi, wheezing or stridor.  Cardio: RRR with no MRGs. Brisk peripheral pulses without edema.  Abdomen: Soft, + BS.  Non tender, no guarding, rebound, hernias, masses. Lymphatics: Non tender without lymphadenopathy.  Musculoskeletal: Full ROM, 5/5 strength, normal gait.  Skin: Warm, dry without rashes, lesions, ecchymosis.  Neuro: Cranial nerves intact. Normal muscle tone, no cerebellar symptoms. Sensation intact.  Psych:  Awake and oriented X 3, normal affect, Insight and Judgment appropriate.     Raynelle Dick, NP 9:54 AM [facility]

## 2021-10-24 ENCOUNTER — Ambulatory Visit (INDEPENDENT_AMBULATORY_CARE_PROVIDER_SITE_OTHER): Payer: BC Managed Care – PPO | Admitting: Nurse Practitioner

## 2021-10-24 ENCOUNTER — Encounter: Payer: Self-pay | Admitting: Nurse Practitioner

## 2021-10-24 VITALS — BP 118/80 | HR 98 | Temp 97.9°F | Resp 17 | Ht 67.25 in | Wt 166.8 lb

## 2021-10-24 DIAGNOSIS — K2900 Acute gastritis without bleeding: Secondary | ICD-10-CM | POA: Diagnosis not present

## 2021-10-24 DIAGNOSIS — R112 Nausea with vomiting, unspecified: Secondary | ICD-10-CM | POA: Diagnosis not present

## 2021-10-24 MED ORDER — ESOMEPRAZOLE MAGNESIUM 40 MG PO CPDR: 40.0000 mg | DELAYED_RELEASE_CAPSULE | Freq: Two times a day (BID) | ORAL | 1 refills | Status: DC

## 2021-10-24 MED ORDER — ONDANSETRON 4 MG PO TBDP: 4.0000 mg | ORAL_TABLET | Freq: Three times a day (TID) | ORAL | 0 refills | Status: DC | PRN

## 2021-10-24 MED ORDER — SUCRALFATE 1 G PO TABS: 1.0000 g | ORAL_TABLET | Freq: Four times a day (QID) | ORAL | 1 refills | Status: DC

## 2021-12-14 ENCOUNTER — Encounter: Payer: Self-pay | Admitting: Nurse Practitioner

## 2021-12-29 ENCOUNTER — Other Ambulatory Visit: Payer: Self-pay

## 2021-12-29 ENCOUNTER — Encounter (HOSPITAL_BASED_OUTPATIENT_CLINIC_OR_DEPARTMENT_OTHER): Payer: Self-pay | Admitting: Emergency Medicine

## 2021-12-29 ENCOUNTER — Emergency Department (HOSPITAL_BASED_OUTPATIENT_CLINIC_OR_DEPARTMENT_OTHER)
Admission: EM | Admit: 2021-12-29 | Discharge: 2021-12-29 | Disposition: A | Discharge status: Home / Self Care | Payer: BC Managed Care – PPO | Attending: Emergency Medicine | Admitting: Emergency Medicine

## 2021-12-29 DIAGNOSIS — J45909 Unspecified asthma, uncomplicated: Secondary | ICD-10-CM | POA: Insufficient documentation

## 2021-12-29 DIAGNOSIS — N189 Chronic kidney disease, unspecified: Secondary | ICD-10-CM | POA: Diagnosis not present

## 2021-12-29 DIAGNOSIS — K920 Hematemesis: Secondary | ICD-10-CM | POA: Insufficient documentation

## 2021-12-29 LAB — CBC
HCT: 47.2 % (ref 39.0–52.0)
Hemoglobin: 16.5 g/dL (ref 13.0–17.0)
MCH: 32 pg (ref 26.0–34.0)
MCHC: 35 g/dL (ref 30.0–36.0)
MCV: 91.7 fL (ref 80.0–100.0)
Platelets: 328 10*3/uL (ref 150–400)
RBC: 5.15 MIL/uL (ref 4.22–5.81)
RDW: 12.5 % (ref 11.5–15.5)
WBC: 9 10*3/uL (ref 4.0–10.5)
nRBC: 0 % (ref 0.0–0.2)

## 2021-12-29 LAB — PROTIME-INR
INR: 1 (ref 0.8–1.2)
Prothrombin Time: 13 seconds (ref 11.4–15.2)

## 2021-12-29 LAB — COMPREHENSIVE METABOLIC PANEL
ALT: 17 U/L (ref 0–44)
AST: 17 U/L (ref 15–41)
Albumin: 4.3 g/dL (ref 3.5–5.0)
Alkaline Phosphatase: 127 U/L — ABNORMAL HIGH (ref 38–126)
Anion gap: 8 (ref 5–15)
BUN: 10 mg/dL (ref 6–20)
CO2: 27 mmol/L (ref 22–32)
Calcium: 9.2 mg/dL (ref 8.9–10.3)
Chloride: 105 mmol/L (ref 98–111)
Creatinine, Ser: 0.89 mg/dL (ref 0.61–1.24)
GFR, Estimated: >60 mL/min (ref >60)
Glucose, Bld: 95 mg/dL (ref 70–99)
Potassium: 4 mmol/L (ref 3.5–5.1)
Sodium: 140 mmol/L (ref 135–145)
Total Bilirubin: 0.8 mg/dL (ref 0.3–1.2)
Total Protein: 7.8 g/dL (ref 6.5–8.1)

## 2021-12-29 MED ORDER — PANTOPRAZOLE SODIUM 40 MG PO TBEC
40.0000 mg | DELAYED_RELEASE_TABLET | Freq: Once | ORAL | Status: AC
  Administered 2021-12-29: 40 mg via ORAL
  Filled 2021-12-29: qty 1

## 2021-12-29 MED ORDER — PANTOPRAZOLE SODIUM 40 MG PO TBEC: 40.0000 mg | DELAYED_RELEASE_TABLET | Freq: Every day | ORAL | 0 refills | Status: DC

## 2021-12-29 NOTE — Discharge Instructions (Signed)
Please return to the ED with any new or worsening signs or symptoms Please follow-up with your PCP for further management Please begin taking once daily Protonix.  Please begin taking this tomorrow. Please read attached guide concerning hematemesis

## 2021-12-29 NOTE — ED Provider Notes (Signed)
[place] EMERGENCY DEPARTMENT Provider Note   CSN: [id number] Arrival date & time: 12/29/21  1217     History  Chief Complaint  Patient presents with   Vomiting    Clinton Jones is a 21 y.o. male with medical history of asthma, CKD, depression, IBS, IgA nephropathy.  Presents to the ED for evaluation of nausea and vomiting with blood in vomit.  Patient reports that this morning he woke up, felt fine however had sudden onset of projectile vomiting out of nowhere.  Patient denies any preceding nausea.  Patient reports that he had 2 episodes of vomiting however only 1 of these episodes had blood in the vomit.  The patient states since this time he has had no abdominal pain, no nausea, no vomiting.  Patient denies any fevers, diarrhea.  Patient denies any blood thinners.  Patient denies any lightheadedness, dizziness, weakness.  HPI     Home Medications Prior to Admission medications   Medication Sig Start Date End Date Taking? Authorizing Provider  pantoprazole (PROTONIX) 40 MG tablet Take 1 tablet (40 mg total) by mouth daily. 12/29/21  Yes Azucena Cecil, PA-C  esomeprazole (NEXIUM) 40 MG capsule Take 1 capsule (40 mg total) by mouth 2 (two) times daily before a meal for 28 days. 10/24/21 11/21/21  Alycia Rossetti, NP  FIBER ADULT GUMMIES PO Take by mouth. Takes 2 a day    [provider]  imipramine (TOFRANIL) 25 MG tablet Take 25 mg by mouth at bedtime. Takes 2 tablet once daily    [provider]  OLANZapine (ZYPREXA) 7.5 MG tablet Take 7.5 mg by mouth at bedtime.    [provider]  ondansetron (ZOFRAN-ODT) 4 MG disintegrating tablet Take 1 tablet (4 mg total) by mouth every 8 (eight) hours as needed for nausea or vomiting. 10/24/21   Alycia Rossetti, NP  sertraline (ZOLOFT) 100 MG tablet Take 100 mg by mouth daily.    [provider]  sucralfate (CARAFATE) 1 g tablet Take 1 tablet (1 g total) by mouth 4 (four) times daily. 10/24/21  10/24/22  Alycia Rossetti, NP      Allergies    Morphine and related    Review of Systems   Review of Systems  Constitutional:  Negative for fever.  Respiratory:  Negative for shortness of breath.   Gastrointestinal:  Positive for nausea and vomiting. Negative for abdominal pain, blood in stool and diarrhea.  Neurological:  Negative for dizziness, weakness and light-headedness.  All other systems reviewed and are negative.   Physical Exam Updated Vital Signs BP 124/76 (BP Location: Left Arm)   Pulse 85   Temp 98.5 F (36.9 C) (Oral)   Resp 18   SpO2 99%  Physical Exam Vitals and nursing note reviewed.  Constitutional:      General: He is not in acute distress.    Appearance: Normal appearance. He is not ill-appearing, toxic-appearing or diaphoretic.  HENT:     Head: Normocephalic and atraumatic.     Nose: Nose normal. No congestion.     Mouth/Throat:     Mouth: Mucous membranes are moist.     Pharynx: Oropharynx is clear.  Eyes:     Extraocular Movements: Extraocular movements intact.     Conjunctiva/sclera: Conjunctivae normal.     Pupils: Pupils are equal, round, and reactive to light.  Cardiovascular:     Rate and Rhythm: Normal rate and regular rhythm.  Pulmonary:     Effort: Pulmonary effort  is normal.     Breath sounds: Normal breath sounds. No wheezing.  Abdominal:     General: Abdomen is flat. Bowel sounds are normal. There is no distension.     Palpations: Abdomen is soft.     Tenderness: There is no abdominal tenderness. There is no rebound.  Musculoskeletal:     Cervical back: Normal range of motion and neck supple. No tenderness.  Skin:    General: Skin is warm and dry.     Capillary Refill: Capillary refill takes less than 2 seconds.  Neurological:     Mental Status: He is alert and oriented to person, place, and time.     ED Results / Procedures / Treatments   Labs (all labs ordered are listed, but only abnormal results are displayed) Labs  Reviewed  COMPREHENSIVE METABOLIC PANEL - Abnormal; Notable for the following components:      Result Value   Alkaline Phosphatase 127 (*)    All other components within normal limits  CBC  PROTIME-INR    EKG None  Radiology No results found.  Procedures Procedures   Medications Ordered in ED Medications  pantoprazole (PROTONIX) EC tablet 40 mg (40 mg Oral Given 12/29/21 1546)    ED Course/ Medical Decision Making/ A&P                           Medical Decision Making Amount and/or Complexity of Data Reviewed Labs: ordered.  Risk Prescription drug management.   21 year old male presents to the ED for evaluation.  Please see HPI for further details.  On my examination the patient is afebrile, nontachycardic.  Patient lung sounds clear bilaterally, not hypoxic.  Patient abdomen is soft and compressible throughout.  There is no tenderness elicited on examination of the abdomen.  Patient nontoxic in appearance.  Patient work-up will include CBC, BMP, PT/INR.  Patient CBC unremarkable, hemoglobin stable at 16.5.  Patient PT/INR unremarkable.  Patient CMP is unremarkable.  Patient treated with 40 mg of Protonix.  At this time, patient work-up unremarkable.  Patient states that he feels fine, no nausea or vomiting since being here.  The patient denies any lightheadedness, dizziness or weakness.  Patient will be discharged home and advised to follow-up with his PCP for further management.  The patient was advised to return to the ED with any recurrence of blood in vomit.  Patient voices understanding of my instructions.  Patient stable for discharge.  Final Clinical Impression(s) / ED Diagnoses Final diagnoses:  Hematemesis without nausea    Rx / DC Orders ED Discharge Orders          Ordered    pantoprazole (PROTONIX) 40 MG tablet  Daily        12/29/21 1654              Al Decant, [place] 12/29/21 1654    Tegeler, Canary Brim, MD 12/30/21 0003

## 2021-12-29 NOTE — ED Triage Notes (Signed)
Patient presents to ED via POV from home. Here after vomiting x2. Reports blood in vomit. Denies abdominal pain. Well appearing. Denies any symptoms.

## 2022-01-14 ENCOUNTER — Encounter: Payer: Self-pay | Admitting: Nurse Practitioner

## 2022-01-14 ENCOUNTER — Other Ambulatory Visit: Payer: Self-pay | Admitting: Nurse Practitioner

## 2022-01-14 DIAGNOSIS — K920 Hematemesis: Secondary | ICD-10-CM

## 2022-02-09 NOTE — Progress Notes (Deleted)
 Complete Physical  Assessment and Plan: Health Maintenance- Discussed STD testing, safe sex, alcohol and drug awareness, drinking and driving dangers, wearing a seat belt and general safety measures for young adult. Jevaun was seen today for annual exam.  Diagnoses and all orders for this visit:  Encounter for general adult medical examination with abnormal findings Due Yearly  IgA nephropathy -     CBC with Differential/Platelet -     COMPLETE METABOLIC PANEL WITH GFR Continue to follow with nephrology  Bulimia nervosa Continue to follow with psychiatry Discussed diet and need for MVI and protein States he is currently eating breakfast and dinner, has not been binging and purging  Major depression, chronic -     buPROPion (WELLBUTRIN XL) 150 MG 24 hr tablet; Take 1 tablet (150 mg total) by mouth every morning. Instructed patient to contact office or on-call physician promptly should condition worsen or any new symptoms appear. IF THE PATIENT HAS ANY SUICIDAL OR HOMICIDAL IDEATIONS, CALL THE OFFICE, DISCUSS WITH A SUPPORT MEMBER, OR GO TO THE ER IMMEDIATELY. Patient was agreeable with this plan.  Continue to follow with psychiatry  Migraine without aura and without status migrainosus, not intractable Currently well controlled  Nicotine dependence due to vaping tobacco product Counseled on the dangers of vaping and stressed the importance of quitting.  He is not currently ready to quit.   Screening for hematuria or proteinuria -     Urinalysis, Routine w reflex microscopic -     Microalbumin / creatinine urine ratio  Screening for thyroid disorder -     TSH  Screening, lipid -     Lipid panel  Screening for diabetes mellitus -     Hemoglobin A1c  Vitamin D deficiency -     VITAMIN D 25 Hydroxy (Vit-D Deficiency, Fractures)  Medication management -     Magnesium  Screening for STD (sexually transmitted disease) -     C. trachomatis/N. gonorrhoeae RNA -     HIV  Antibody (routine testing w rflx) -     RPR  Flu vaccine need -     Flu Vaccine QUAD 6+ mos PF IM (Fluarix Quad PF)     Discussed med's effects and SE's. Screening labs and tests as requested with regular follow-up as recommended. Over 40 minutes of exam, counseling, chart review and critical decision making was performed   HPI  This very nice 21 y.o.male presents for complete physical.  Patient has no major health issues.  Patient reports no complaints at this time.  BMI is There is no height or weight on file to calculate BMI., he has been working on diet and exercise. Wt Readings from Last 3 Encounters:  10/24/21 166 lb 12.8 oz (75.7 kg)  04/01/21 119 lb (54 kg) (3 %, Z= -1.85)*  02/10/21 115 lb (52.2 kg) (2 %, Z= -2.11)*   * Growth percentiles are based on CDC (Boys, 2-20 Years) data.   He does note that his depression does seem to be slightly worse currently. Currently on Zoloft and symptoms are not controlled.  Has tried Prozac and Lexapro in the past. He used Lamictal in the past with no results. He does not find joy in things, states he is having suicidal thoughts but does not have a plan. He has an upcoming appointment with psychiatry on 02/20/21.  He does not workout.  Finally, patient has history of Vitamin D Deficiency and last vitamin D was  Lab Results  Component Value Date  VD25OH 25 (L) 02/10/2021  .  Currently on supplementation  Current Medications:  Current Outpatient Medications on File Prior to Visit  Medication Sig Dispense Refill   esomeprazole (NEXIUM) 40 MG capsule Take 1 capsule (40 mg total) by mouth 2 (two) times daily before a meal for 28 days. 28 capsule 1   FIBER ADULT GUMMIES PO Take by mouth. Takes 2 a day     imipramine (TOFRANIL) 25 MG tablet Take 25 mg by mouth at bedtime. Takes 2 tablet once daily     OLANZapine (ZYPREXA) 7.5 MG tablet Take 7.5 mg by mouth at bedtime.     ondansetron (ZOFRAN-ODT) 4 MG disintegrating tablet Take 1 tablet (4  mg total) by mouth every 8 (eight) hours as needed for nausea or vomiting. 20 tablet 0   pantoprazole (PROTONIX) 40 MG tablet Take 1 tablet (40 mg total) by mouth daily. 30 tablet 0   sertraline (ZOLOFT) 100 MG tablet Take 100 mg by mouth daily.     sucralfate (CARAFATE) 1 g tablet Take 1 tablet (1 g total) by mouth 4 (four) times daily. 120 tablet 1   No current facility-administered medications on file prior to visit.   Health Maintenance:   Immunization History  Administered Date(s) Administered   DTaP 11/17/2001, 01/10/2002, 03/14/2002, 12/25/2002, 09/23/2005   HIB (PRP-OMP) 11/17/2001, 01/10/2002, 03/14/2002, 09/13/2002   Hepatitis A 11/06/2004, 09/23/2005   Hepatitis B June 14, 2001, 10/17/2001, 06/19/2002   IPV 11/17/2001, 01/10/2002, 12/25/2002, 09/23/2005   Influenza,inj,Quad PF,6+ Mos 02/10/2021   Influenza-Unspecified 11/30/2011, 01/03/2014, 12/09/2018   MMR 09/13/2002, 09/23/2005   MenQuadfi_Meningococcal Groups ACYW Conjugate 07/18/2013, 08/05/2018   Meningococcal B, OMV 08/05/2018, 10/30/2020   Pneumococcal-Unspecified 11/17/2001, 01/10/2002, 03/14/2002, 11/22/2002   Tdap 05/20/2012   Varicella 12/25/2002, 09/23/2005    TD/TDAP: Influenza:02/10/21 Pneumovax:  HPV vaccines:  Sexually Active: yes STD testing offered Last Dental Exam: Last Eye Exam:  Allergies:  Allergies  Allergen Reactions   Morphine And Related Hives, Nausea And Vomiting and Rash   Medical History:  has Migraine with aura, without mention of intractable migraine without mention of status migrainosus; Tension headache; Migraine without aura and without status migrainosus, not intractable; Mild recurrent major depression ([place]); Cannabis use disorder, moderate, dependence ([place]); Nicotine dependence due to vaping tobacco product; Abdominal pain, chronic, generalized; Abnormal weight loss; IgA nephropathy; Irritable bowel syndrome with constipation and diarrhea; Recurrent oral ulcers; and Bulimia nervosa  on their problem list. Surgical History:  He  has a past surgical history that includes Tympanostomy (Bilateral, 2004); Adenoidectomy (Bilateral, 2004); and Circumcision (2003). Family History:  Hisfamily history includes Allergic rhinitis in his brother, father, and mother; Asthma in his brother; Depression in his father and paternal grandfather; Heart attack in his maternal grandfather and maternal grandmother; Hypertension in his father, maternal grandfather, and maternal grandmother; Migraines in his mother; Nephrotic syndrome in his father; Pancreatic cancer in his maternal grandfather. Social History:   reports that he has been smoking e-cigarettes. He started smoking about 5 years ago. He uses smokeless tobacco. He reports current drug use. Drug: Marijuana. He reports that he does not drink alcohol.  Review of Systems: Review of Systems  Constitutional:  Negative for chills and fever.  HENT:  Negative for congestion, hearing loss, sinus pain, sore throat and tinnitus.   Eyes:  Negative for blurred vision and double vision.  Respiratory:  Negative for cough, hemoptysis, sputum production, shortness of breath and wheezing.   Cardiovascular:  Negative for chest pain, palpitations and leg swelling.  Gastrointestinal:  Negative for abdominal pain, constipation, diarrhea, heartburn, nausea and vomiting.  Genitourinary:  Negative for dysuria, frequency, hematuria and urgency.  Musculoskeletal:  Negative for back pain, falls, joint pain, myalgias and neck pain.  Skin:  Negative for rash.  Neurological:  Negative for dizziness, tingling, tremors, weakness and headaches.  Endo/Heme/Allergies:  Does not bruise/bleed easily.  Psychiatric/Behavioral:  Positive for depression and suicidal ideas. The patient is not nervous/anxious and does not have insomnia.     Physical Exam: Estimated body mass index is 25.93 kg/m as calculated from the following:   Height as of 10/24/21: 5' 7.25" (1.708 m).    Weight as of 10/24/21: 166 lb 12.8 oz (75.7 kg). There were no vitals taken for this visit. General Appearance: very pleasant thin male, in no apparent distress.  Eyes: PERRLA, EOMs, conjunctiva no swelling or erythema, normal fundi and vessels.  Sinuses: No Frontal/maxillary tenderness  ENT/Mouth: Ext aud canals clear, normal light reflex with TMs without erythema, bulging. Good dentition. No erythema, swelling, or exudate on post pharynx. Tonsils not swollen or erythematous. Hearing normal.  Neck: Supple, thyroid normal. No bruits  Respiratory: Respiratory effort normal, BS equal bilaterally without rales, rhonchi, wheezing or stridor.  Cardio: RRR without murmurs, rubs or gallops. Brisk peripheral pulses without edema.  Chest: symmetric, with normal excursions and percussion.  Abdomen: Soft, nontender, no guarding, rebound, hernias, masses, or organomegaly.  Lymphatics: Non tender without lymphadenopathy.  Genitourinary: defer Musculoskeletal: Full ROM all peripheral extremities,5/5 strength, and normal gait.  Skin: Warm, dry without rashes, lesions, ecchymosis. Neuro: Cranial nerves intact, reflexes equal bilaterally. Normal muscle tone, no cerebellar symptoms. Sensation intact.  Psych: Awake and oriented X 3, normal affect, Insight and Judgment appropriate.   EKG: defer  Sherol Sabas E  12:50 PM [facility]

## 2022-02-10 ENCOUNTER — Encounter: Payer: BC Managed Care – PPO | Admitting: Nurse Practitioner

## 2022-02-26 ENCOUNTER — Encounter: Payer: Self-pay | Admitting: Gastroenterology

## 2022-03-30 ENCOUNTER — Encounter: Payer: Self-pay | Admitting: Gastroenterology

## 2022-03-30 ENCOUNTER — Ambulatory Visit: Payer: BC Managed Care – PPO | Admitting: Gastroenterology

## 2022-03-30 VITALS — BP 116/72 | HR 87 | Ht 68.0 in | Wt 142.0 lb

## 2022-03-30 DIAGNOSIS — K920 Hematemesis: Secondary | ICD-10-CM | POA: Diagnosis not present

## 2022-03-30 DIAGNOSIS — R112 Nausea with vomiting, unspecified: Secondary | ICD-10-CM | POA: Diagnosis not present

## 2022-03-30 MED ORDER — ONDANSETRON HCL 4 MG PO TABS: 4.0000 mg | ORAL_TABLET | Freq: Three times a day (TID) | ORAL | 1 refills | Status: DC | PRN

## 2022-03-30 MED ORDER — PROCHLORPERAZINE 25 MG RE SUPP: 25.0000 mg | Freq: Two times a day (BID) | RECTAL | 0 refills | Status: DC | PRN

## 2022-03-30 NOTE — Progress Notes (Signed)
[facility] Consult Note:  History: Alonso Gapinski 03/30/2022  Referring provider: Unk Pinto, MD  Reason for consult/chief complaint: Hematemesis (Has been happening for a couple of years, having vomiting in the morning. Goes on again and off again, had a episodes of dark vomit. )   Subjective  HPI: Referred by primary care for episodic nausea and vomiting.  An episode November 2023 had associated hematemesis and led to an ED visit.  Patient stable, lab reports as below, did not require admission.  Barry reports a few years of vomiting that occurs fairly regularly.  It may happen 4 days a week at a time, then not happen for days or weeks.  When it occurs, it is exclusively in the morning usually but wakes him up at about 5 AM.  That may not be much noticed before he has to go to the bathroom to throw up multiple times.  After about 30 minutes of this he is washed out feels cold.  He does not vomit during the remainder of the day.  He is also not been able to correlate it with whether or not he eats and then evening or how much he may consume. He reports an underlying history of IBS and says his bowel habits are regular.  He saw a pediatric GI physician at [facility] years ago he was diagnosed with IgA nephropathy and they did what sounds like EGD and colonoscopy to rule out Crohn's disease.  He also reports a prior eating disorder with anorexia and bulimia, though that is no longer an issue. He is also stopped taking all of his medications because he was not sure any of them are helping.  No nausea medicines on hand.   ROS:  Review of Systems  Constitutional:  Negative for appetite change and unexpected weight change.  HENT:  Negative for mouth sores and voice change.   Eyes:  Negative for pain and redness.  Respiratory:  Negative for cough and shortness of breath.   Cardiovascular:  Negative for chest pain and palpitations.  Genitourinary:  Negative for  dysuria and hematuria.  Musculoskeletal:  Negative for arthralgias and myalgias.  Skin:  Negative for pallor and rash.  Neurological:  Negative for weakness and headaches.  Hematological:  Negative for adenopathy.  Psychiatric/Behavioral:         Suffers with depression, says it is lately stable and he does not feel he needs meds.     Past Medical History: Past Medical History:  Diagnosis Date   Asthma    Chronic kidney disease    Depression    Headache(784.0)    IBS (irritable bowel syndrome)    IgA nephropathy with nephrotic syndrome      Past Surgical History: Past Surgical History:  Procedure Laterality Date   ADENOIDECTOMY Bilateral 2004   CIRCUMCISION  2003   TYMPANOSTOMY Bilateral 2004     Family History: Family History  Problem Relation Age of Onset   Migraines Mother    Allergic rhinitis Mother    Allergic rhinitis Father    Hypertension Father    Nephrotic syndrome Father    Depression Father    Allergic rhinitis Brother    Asthma Brother    Hypertension Maternal Grandmother    Heart attack Maternal Grandmother    Pancreatic cancer Maternal Grandfather    Hypertension Maternal Grandfather    Heart attack Maternal Grandfather    Depression Paternal Grandfather     Social History: Social History   Socioeconomic  History   Marital status: Single    Spouse name: Not on file   Number of children: Not on file   Years of education: Not on file   Highest education level: 11th grade  Occupational History   Occupation: server    Employer: [employer]  Tobacco Use   Smoking status: Every Day    Types: E-cigarettes    Start date: 01/14/2017   Smokeless tobacco: Current   Tobacco comments:    Vape everyday   Vaping Use   Vaping Use: Every day  Substance and Sexual Activity   Alcohol use: No   Drug use: Yes    Types: Marijuana   Sexual activity: Not on file  Other Topics Concern   Not on file  Social History Narrative   While Raymundo attended 8th grade  at [employer] doing well living with parents and brother, Kiefer in 12th grade at [employer] school lacks English assignments to graduate which mother considers he could make up but he refuses until next year or never finishing high school.  He has a job the last couple of weeks appreciating the pay but not enjoying the work.  He has recent cannabis charges by the police on his car which father expects will establish a criminal record while patient doubts that he will be prosecuted.  Demarri is inconsistent in his history varying accounts of substance use, self-harm, depression and anxiety.  Mother suggest GI work-up in the past for IBS may have been more for symptoms and consequences of bulimia.  Cannabis is 3 or 4 times daily approximately $200 weekly and with cigarettes and vaping tobacco of several years that calm him as does cutting his thighs which may have occurred a couple of years ago or couple months ago.  He drives effectively disapproving that maternal grandmother took his bedroom at mother's home when her father died.  Patient has run away and has moved to live with a cousin but plans to live with boyfriend at least by 12 years of age in 2 months.   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  He vapes and smokes marijuana daily. Allergies: Allergies  Allergen Reactions   Morphine And Related Hives, Nausea And Vomiting and Rash    Outpatient Meds: Current Outpatient Medications  Medication Sig Dispense Refill   esomeprazole (NEXIUM) 40 MG capsule Take 1 capsule (40 mg total) by mouth 2 (two) times daily before a meal for 28 days. 28 capsule 1   FIBER ADULT GUMMIES PO Take by mouth. Takes 2 a day  (Patient not taking: Reported on 03/30/2022)     imipramine (TOFRANIL) 25 MG tablet Take 25 mg by mouth at bedtime. Takes 2 tablet once daily (Patient not taking: Reported on 03/30/2022)     OLANZapine (ZYPREXA) 7.5 MG tablet Take 7.5 mg by mouth at bedtime. (Patient not taking: Reported on 03/30/2022)     ondansetron (ZOFRAN-ODT) 4 MG disintegrating tablet Take 1 tablet (4 mg total) by mouth every 8 (eight) hours as needed for nausea or vomiting. (Patient not taking: Reported on 03/30/2022) 20 tablet 0   pantoprazole (PROTONIX) 40 MG tablet Take 1 tablet (40 mg total) by mouth daily. (  Patient not taking: Reported on 03/30/2022) 30 tablet 0   sertraline (ZOLOFT) 100 MG tablet Take 100 mg by mouth daily. (Patient not taking: Reported on 03/30/2022)     sucralfate (CARAFATE) 1 g tablet Take 1 tablet (1 g total) by mouth 4 (four) times daily. (Patient not taking: Reported on 03/30/2022) 120 tablet 1   No current facility-administered medications for this visit.    See note above, he is currently not taking any medications.  ___________________________________________________________________ Objective   Exam:  BP 116/72   Pulse 87   Ht 5\' 8"  (1.727 m)   Wt 142 lb (64.4 kg)   BMI 21.59 kg/m  Wt Readings from Last 3 Encounters:  03/30/22 142 lb (64.4 kg)  10/24/21 166 lb 12.8 oz (75.7 kg)  04/01/21 119 lb (54 kg) (3 %, Z= -1.85)*   * Growth percentiles are based on CDC (Boys, 2-20 Years) data.   Note significant weight loss recorded above in last several months. General: Pleasant and well-appearing. Eyes: sclera anicteric, no redness ENT: oral mucosa moist without lesions, no cervical or supraclavicular lymphadenopathy CV: Regular without appreciable murmur, no JVD, no peripheral edema Resp: clear to auscultation bilaterally, normal RR and effort noted GI: soft, no tenderness, with active bowel sounds. No guarding or palpable organomegaly noted. Skin; warm and dry, no rash or jaundice noted Neuro:  awake, alert and oriented x 3. Normal gross motor function and fluent speech  Labs:     Latest Ref Rng & Units 12/29/2021    3:39 PM 02/10/2021    3:52 PM 07/10/2019   11:36 PM  CBC  WBC 4.0 - 10.5 K/uL 9.0  7.0  7.7   Hemoglobin 13.0 - 17.0 g/dL 16.5  14.5  13.5   Hematocrit 39.0 - 52.0 % 47.2  41.5  40.1   Platelets 150 - 400 K/uL 328  270  245       Latest Ref Rng & Units 12/29/2021    3:39 PM 02/10/2021    3:52 PM 07/10/2019   11:36 PM  CMP  Glucose 70 - 99 mg/dL 95  80  117   BUN 6 - 20 mg/dL 10  9  9    Creatinine 0.61 - 1.24 mg/dL 0.89  0.86  0.88   Sodium 135 - 145 mmol/L 140  139  137   Potassium 3.5 - 5.1 mmol/L 4.0  4.5  3.7   Chloride 98 - 111 mmol/L 105  102  104   CO2 22 - 32 mmol/L 27  30  25    Calcium 8.9 - 10.3 mg/dL 9.2  9.4  8.9   Total Protein 6.5 - 8.1 g/dL 7.8  6.6  6.1   Total Bilirubin 0.3 - 1.2 mg/dL 0.8  0.4  0.7   Alkaline Phos 38 - 126 U/L 127   95   AST 15 - 41 U/L 17  12  19    ALT 0 - 44 U/L 17  7  22       Radiologic Studies:  No abdominal imaging on file in this EMR since 2009 (when he was age 83, to rule out appendicitis)  Assessment: Encounter Diagnoses  Name Primary?   Nausea and vomiting in adult Yes   Hematemesis with nausea     Chronic nausea vomiting, occurs in the morning in the setting of history noted above.  Reported few episodes of hematemesis, most recently November 2023.  Weight loss noted above coinciding with more frequent symptoms in recent months.  He  was previously told this could be cannabis hyperemesis, but he doubts that since it does not occur at the time he is smoking marijuana.  I explained that his the likely diagnosis, and strongly suggested he stop using marijuana to help sort this out. I offered antiemetics both orally and via suppository if needed to have on hand to decrease the severity of these episodes.  He was agreeable to that and prescriptions were sent for ondansetron tablets and Compazine  suppository.  Recommend further testing with upper endoscopy and possible gastric emptying study (patients with a history of bulimia can develop gastroparesis).  He would like to hold off on that for now while making the efforts noted above.  I encouraged him to keep in touch with me in the near future by phone call or portal message so we can proceed accordingly.  Thank you for the courtesy of this consult.  Please call me with any questions or concerns.  Charlie Pitter III  CC: Referring provider noted above

## 2022-03-30 NOTE — Patient Instructions (Signed)
_______________________________________________________  If your blood pressure at your visit was 140/90 or greater, please contact your primary care physician to follow up on this.  _______________________________________________________  If you are age 21 or older, your body mass index should be between 23-30. Your Body mass index is 21.59 kg/m. If this is out of the aforementioned range listed, please consider follow up with your Primary Care Provider.  If you are age 14 or younger, your body mass index should be between 19-25. Your Body mass index is 21.59 kg/m. If this is out of the aformentioned range listed, please consider follow up with your Primary Care Provider.   ________________________________________________________  The [facility] providers would like to encourage you to use [facility] to communicate with providers for non-urgent requests or questions.  Due to long hold times on the telephone, sending your provider a message by [facility] may be a faster and more efficient way to get a response.  Please allow 48 business hours for a response.  Please remember that this is for non-urgent requests.  _______________________________________________________  Follow up as needed.   It was a pleasure to see you today!  Thank you for trusting me with your gastrointestinal care!

## 2022-04-15 ENCOUNTER — Encounter: Payer: Self-pay | Admitting: Nurse Practitioner

## 2022-04-16 ENCOUNTER — Emergency Department (HOSPITAL_COMMUNITY)
Admission: EM | Admit: 2022-04-16 | Discharge: 2022-04-16 | Disposition: A | Discharge status: Home / Self Care | Payer: BC Managed Care – PPO | Attending: Emergency Medicine | Admitting: Emergency Medicine

## 2022-04-16 ENCOUNTER — Encounter (HOSPITAL_COMMUNITY): Payer: Self-pay

## 2022-04-16 ENCOUNTER — Emergency Department (HOSPITAL_COMMUNITY): Payer: BC Managed Care – PPO

## 2022-04-16 ENCOUNTER — Other Ambulatory Visit: Payer: Self-pay

## 2022-04-16 DIAGNOSIS — K529 Noninfective gastroenteritis and colitis, unspecified: Secondary | ICD-10-CM | POA: Insufficient documentation

## 2022-04-16 DIAGNOSIS — R1031 Right lower quadrant pain: Secondary | ICD-10-CM | POA: Diagnosis present

## 2022-04-16 LAB — CBC
HCT: 46.5 % (ref 39.0–52.0)
Hemoglobin: 15.9 g/dL (ref 13.0–17.0)
MCH: 31.5 pg (ref 26.0–34.0)
MCHC: 34.2 g/dL (ref 30.0–36.0)
MCV: 92.1 fL (ref 80.0–100.0)
Platelets: 260 10*3/uL (ref 150–400)
RBC: 5.05 MIL/uL (ref 4.22–5.81)
RDW: 12.6 % (ref 11.5–15.5)
WBC: 6.7 10*3/uL (ref 4.0–10.5)
nRBC: 0 % (ref 0.0–0.2)

## 2022-04-16 LAB — LIPASE, BLOOD: Lipase: 31 U/L (ref 11–51)

## 2022-04-16 LAB — URINALYSIS, ROUTINE W REFLEX MICROSCOPIC
Bilirubin Urine: NEGATIVE
Glucose, UA: NEGATIVE mg/dL
Ketones, ur: 20 mg/dL — AB
Leukocytes,Ua: NEGATIVE
Nitrite: NEGATIVE
Protein, ur: >=300 mg/dL — AB
RBC / HPF: >50 RBC/hpf (ref 0–5)
Specific Gravity, Urine: 1.019 (ref 1.005–1.030)
pH: 5 (ref 5.0–8.0)

## 2022-04-16 LAB — COMPREHENSIVE METABOLIC PANEL
ALT: 11 U/L (ref 0–44)
AST: 14 U/L — ABNORMAL LOW (ref 15–41)
Albumin: 3.7 g/dL (ref 3.5–5.0)
Alkaline Phosphatase: 96 U/L (ref 38–126)
Anion gap: 9 (ref 5–15)
BUN: 5 mg/dL — ABNORMAL LOW (ref 6–20)
CO2: 27 mmol/L (ref 22–32)
Calcium: 9.1 mg/dL (ref 8.9–10.3)
Chloride: 101 mmol/L (ref 98–111)
Creatinine, Ser: 1.01 mg/dL (ref 0.61–1.24)
GFR, Estimated: >60 mL/min (ref >60)
Glucose, Bld: 97 mg/dL (ref 70–99)
Potassium: 4 mmol/L (ref 3.5–5.1)
Sodium: 137 mmol/L (ref 135–145)
Total Bilirubin: 1.1 mg/dL (ref 0.3–1.2)
Total Protein: 6.9 g/dL (ref 6.5–8.1)

## 2022-04-16 LAB — C-REACTIVE PROTEIN: CRP: 5.8 mg/dL — ABNORMAL HIGH (ref <1.0)

## 2022-04-16 LAB — SEDIMENTATION RATE: Sed Rate: 4 mm/hr (ref 0–16)

## 2022-04-16 MED ORDER — METRONIDAZOLE 500 MG PO TABS
500.0000 mg | ORAL_TABLET | Freq: Three times a day (TID) | ORAL | Status: DC
  Administered 2022-04-16: 500 mg via ORAL
  Filled 2022-04-16: qty 1

## 2022-04-16 MED ORDER — CIPROFLOXACIN HCL 500 MG PO TABS
500.0000 mg | ORAL_TABLET | Freq: Once | ORAL | Status: AC
  Administered 2022-04-16: 500 mg via ORAL
  Filled 2022-04-16: qty 1

## 2022-04-16 MED ORDER — CIPROFLOXACIN HCL 500 MG PO TABS: 500.0000 mg | ORAL_TABLET | Freq: Two times a day (BID) | ORAL | 0 refills | Status: AC

## 2022-04-16 MED ORDER — METRONIDAZOLE 500 MG PO TABS: 500.0000 mg | ORAL_TABLET | Freq: Three times a day (TID) | ORAL | 0 refills | Status: AC

## 2022-04-16 MED ORDER — IOHEXOL 350 MG/ML SOLN
70.0000 mL | Freq: Once | INTRAVENOUS | Status: AC | PRN
  Administered 2022-04-16: 70 mL via INTRAVENOUS

## 2022-04-16 NOTE — ED Provider Notes (Signed)
22 yo male here with abdominal pain, rectal bleeding Hgb 15.9   Physical Exam  BP 115/62   Pulse 78   Temp 98.3 F (36.8 C) (Oral)   Resp 19   Ht 5' 8"$  (1.727 m)   Wt 63.5 kg   SpO2 98%   BMI 21.29 kg/m   Physical Exam  Procedures  Procedures  ED Course / MDM   Clinical Course as of 04/16/22 1503  Thu Apr 16, 2022  1419 Patient is seen [facility] as an outpatient most recently 1 week ago. [MB]    Clinical Course User Index [MB] Hayden Rasmussen, MD   Medical Decision Making Amount and/or Complexity of Data Reviewed Labs: ordered. Radiology: ordered.  Risk Prescription drug management.   CT consistent with colitis.  Pt in private reports no hx of receptive anal intercourse.  We'll treat with antibiotics now for infectious etiology and he will f/u with GI.  WE discussed possibility of inflammatory bowel disease; he states he tested "negative for Crohn's" at [facility] in the past.  His GI's office can f/u on this.  With his mild inflammation and no WBC elevation I do not see an indication for steroids at this time; I think a trial of antibiotics is reasonable.  Discussed fluid diet for 2 days.       Wyvonnia Dusky, MD 04/16/22 [phone]

## 2022-04-16 NOTE — ED Triage Notes (Signed)
Pt arrived POV from home c/o lower abdominal pain and blood in his stool x a couple days. Pt states prior to this he had not had a bowel movement in 4 days.

## 2022-04-16 NOTE — ED Provider Notes (Signed)
[facility] Provider Note   CSN: [id number] Arrival date & time: 04/16/22  [id number]     History  Chief Complaint  Patient presents with   Abdominal Pain    Clinton Jones is a 22 y.o. male.  He has a history of IBS.  He is complaining of some lower abdominal crampy pain and some blood in his stool it has been going on for about a week.  He said it has been getting progressively worse and today he was leaking some blood without stooling.  No fevers chills nausea vomiting.  The symptoms are new.  No change in his medications.  He said initially he was constipated but now is moving his bowels and they are not hard.  He said the stool is mostly brown although may be a little red-colored.   Abdominal Pain Pain location:  RLQ, LLQ and suprapubic Pain quality: cramping   Pain severity:  Mild Onset quality:  Gradual Duration:  1 week Timing:  Intermittent Progression:  Unchanged Chronicity:  New Context: not trauma   Relieved by:  Nothing Worsened by:  Bowel movements Ineffective treatments:  None tried Associated symptoms: constipation and hematochezia   Associated symptoms: no cough, no fever, no hematemesis, no hematuria, no nausea and no vomiting        Home Medications Prior to Admission medications   Medication Sig Start Date End Date Taking? Authorizing Provider  esomeprazole (NEXIUM) 40 MG capsule Take 1 capsule (40 mg total) by mouth 2 (two) times daily before a meal for 28 days. 10/24/21 11/21/21  Alycia Rossetti, NP  FIBER ADULT GUMMIES PO Take by mouth. Takes 2 a day Patient not taking: Reported on 03/30/2022    [provider]  imipramine (TOFRANIL) 25 MG tablet Take 25 mg by mouth at bedtime. Takes 2 tablet once daily Patient not taking: Reported on 03/30/2022    [provider]  OLANZapine (ZYPREXA) 7.5 MG tablet Take 7.5 mg by mouth at bedtime. Patient not taking: Reported on 03/30/2022    [provider]   ondansetron (ZOFRAN) 4 MG tablet Take 1 tablet (4 mg total) by mouth every 8 (eight) hours as needed for nausea or vomiting. 03/30/22   Doran Stabler, MD  ondansetron (ZOFRAN-ODT) 4 MG disintegrating tablet Take 1 tablet (4 mg total) by mouth every 8 (eight) hours as needed for nausea or vomiting. Patient not taking: Reported on 03/30/2022 10/24/21   Alycia Rossetti, NP  pantoprazole (PROTONIX) 40 MG tablet Take 1 tablet (40 mg total) by mouth daily. Patient not taking: Reported on 03/30/2022 12/29/21   Azucena Cecil, PA-C  prochlorperazine (COMPAZINE) 25 MG suppository Place 1 suppository (25 mg total) rectally every 12 (twelve) hours as needed for nausea or vomiting. For breakthrough nausea and vomiting if unable to tolerate medicines by mouth 03/30/22   Doran Stabler, MD  sertraline (ZOLOFT) 100 MG tablet Take 100 mg by mouth daily. Patient not taking: Reported on 03/30/2022    [provider]  sucralfate (CARAFATE) 1 g tablet Take 1 tablet (1 g total) by mouth 4 (four) times daily. Patient not taking: Reported on 03/30/2022 10/24/21 10/24/22  Alycia Rossetti, NP      Allergies    Morphine and related    Review of Systems   Review of Systems  Constitutional:  Negative for fever.  Respiratory:  Negative for cough.   Gastrointestinal:  Positive for abdominal pain, anal bleeding,  blood in stool, constipation and hematochezia. Negative for hematemesis, nausea and vomiting.  Genitourinary:  Negative for hematuria.    Physical Exam Updated Vital Signs BP 118/76 (BP Location: Right Arm)   Pulse 94   Temp 98.5 F (36.9 C) (Oral)   Resp 18   Ht 5' 8"$  (1.727 m)   Wt 63.5 kg   SpO2 96%   BMI 21.29 kg/m  Physical Exam Vitals and nursing note reviewed.  Constitutional:      General: He is not in acute distress.    Appearance: He is well-developed.  HENT:     Head: Normocephalic and atraumatic.  Eyes:     Conjunctiva/sclera: Conjunctivae normal.  Cardiovascular:      Rate and Rhythm: Normal rate and regular rhythm.     Heart sounds: No murmur heard. Pulmonary:     Effort: Pulmonary effort is normal. No respiratory distress.     Breath sounds: Normal breath sounds.  Abdominal:     Palpations: Abdomen is soft.     Tenderness: There is no abdominal tenderness. There is no guarding or rebound.  Musculoskeletal:        General: No swelling.     Cervical back: Neck supple.  Skin:    General: Skin is warm and dry.     Capillary Refill: Capillary refill takes less than 2 seconds.  Neurological:     General: No focal deficit present.     Mental Status: He is alert.     ED Results / Procedures / Treatments   Labs (all labs ordered are listed, but only abnormal results are displayed) Labs Reviewed  COMPREHENSIVE METABOLIC PANEL - Abnormal; Notable for the following components:      Result Value   BUN 5 (*)    AST 14 (*)    All other components within normal limits  URINALYSIS, ROUTINE W REFLEX MICROSCOPIC - Abnormal; Notable for the following components:   Color, Urine AMBER (*)    APPearance HAZY (*)    Hgb urine dipstick LARGE (*)    Ketones, ur 20 (*)    Protein, ur >=300 (*)    Bacteria, UA RARE (*)    All other components within normal limits  C-REACTIVE PROTEIN - Abnormal; Notable for the following components:   CRP 5.8 (*)    All other components within normal limits  LIPASE, BLOOD  CBC  SEDIMENTATION RATE  POC OCCULT BLOOD, ED    EKG None  Radiology CT Abdomen Pelvis W Contrast  Result Date: 04/16/2022 CLINICAL DATA:  LLQ abdominal pain lower abd pain, rectal bleeding EXAM: CT ABDOMEN AND PELVIS WITH CONTRAST TECHNIQUE: Multidetector CT imaging of the abdomen and pelvis was performed using the standard protocol following bolus administration of intravenous contrast. RADIATION DOSE REDUCTION: This exam was performed according to the departmental dose-optimization program which includes automated exposure control, adjustment of the  mA and/or kV according to patient size and/or use of iterative reconstruction technique. CONTRAST:  65m OMNIPAQUE IOHEXOL 350 MG/ML SOLN COMPARISON:  11/29/2007 FINDINGS: Lower chest: Included lung bases are clear.  Heart size is normal. Hepatobiliary: No focal liver abnormality is seen. No gallstones, gallbladder wall thickening, or biliary dilatation. Pancreas: Unremarkable. No pancreatic ductal dilatation or surrounding inflammatory changes. Spleen: Normal in size without focal abnormality. Adrenals/Urinary Tract: Unremarkable adrenal glands. Kidneys enhance symmetrically without focal lesion, stone, or hydronephrosis. Ureters are nondilated. Urinary bladder wall appears mildly thickened, which may be accentuated by under distension. Stomach/Bowel: Stomach is within normal limits. Appendix  appears normal (series 6, image 41). No dilated loops of bowel. Mild long segment wall thickening involving the distal descending colon and sigmoid colon. No fat stranding or pericolonic fluid. Vascular/Lymphatic: No significant vascular findings are present. No enlarged abdominal or pelvic lymph nodes. Reproductive: Prostate is unremarkable. Other: Trace free fluid within the pelvis is likely reactive. No abdominopelvic fluid collection. No pneumoperitoneum. No abdominal wall hernia. Musculoskeletal: No acute or significant osseous findings. Mild degenerative disc disease at L5-S1. IMPRESSION: 1. Mild long segment wall thickening involving the distal descending colon and sigmoid colon, suggestive of a mild infectious or inflammatory colitis. 2. Urinary bladder wall appears mildly thickened, which may be accentuated by under distension. Correlate with urinalysis to exclude cystitis. 3. Trace free fluid within the pelvis is likely reactive. 4. Normal appendix. 5. Mild degenerative disc disease at L5-S1. Electronically Signed   By: Davina Poke D.O.   On: 04/16/2022 15:20    Procedures Procedures    Medications Ordered  in ED Medications  metroNIDAZOLE (FLAGYL) tablet 500 mg (500 mg Oral Given 04/16/22 1554)  iohexol (OMNIPAQUE) 350 MG/ML injection 70 mL (70 mLs Intravenous Contrast Given 04/16/22 1513)  ciprofloxacin (CIPRO) tablet 500 mg (500 mg Oral Given 04/16/22 1554)    ED Course/ Medical Decision Making/ A&P Clinical Course as of 04/16/22 1637  Thu Apr 16, 2022  1419 Patient is seen [facility] as an outpatient most recently 1 week ago. [MB]    Clinical Course User Index [MB] Hayden Rasmussen, MD                             Medical Decision Making Amount and/or Complexity of Data Reviewed Labs: ordered. Radiology: ordered.  Risk Prescription drug management.   This patient complains of rectal bleeding lower abdominal pain; this involves an extensive number of treatment Options and is a complaint that carries with it a high risk of complications and morbidity. The differential includes colitis, diverticulitis, constipation, hemorrhoids, AVM  I ordered, reviewed and interpreted labs, which included CBC with normal white count normal hemoglobin chemistries normal, CRP mildly elevated, normal sed rate, urinalysis with blood I ordered imaging studies which included CT abdomen and pelvis and I independently    visualized and interpreted imaging which showed area of colitis Additional history obtained from patient's father Previous records obtained and reviewed in epic including recent GI notes  Cardiac monitoring reviewed, normal sinus rhythm Social determinants considered, ongoing tobacco use Critical Interventions: None  After the interventions stated above, I reevaluated the patient and found patient to be well-appearing in no distress Admission and further testing considered, his care is signed out to Dr. Langston Masker to follow-up on final reading of CAT scan.  Likely can be discharged plus minus antibiotics to follow-up with GI.         Final Clinical Impression(s) / ED Diagnoses Final  diagnoses:  Colitis    Rx / DC Orders ED Discharge Orders          Ordered    ciprofloxacin (CIPRO) 500 MG tablet  Every 12 hours        04/16/22 1544    metroNIDAZOLE (FLAGYL) 500 MG tablet  3 times daily        04/16/22 1544              Hayden Rasmussen, MD 04/16/22 [phone]

## 2022-04-16 NOTE — Discharge Instructions (Addendum)
Please follow up with your GI doctor's office.

## 2022-04-16 NOTE — ED Notes (Signed)
Discharge instructions reviewed with patient. Patient denies any questions or concerns. Patient verbalized understanding of need for follow-up care. Patient ambulatory out to lobby.

## 2022-05-12 ENCOUNTER — Ambulatory Visit (INDEPENDENT_AMBULATORY_CARE_PROVIDER_SITE_OTHER): Payer: BC Managed Care – PPO | Admitting: Nurse Practitioner

## 2022-05-12 ENCOUNTER — Encounter: Payer: Self-pay | Admitting: Nurse Practitioner

## 2022-05-12 VITALS — BP 120/76 | HR 101 | Temp 97.5°F | Ht 68.0 in | Wt 136.8 lb

## 2022-05-12 DIAGNOSIS — N342 Other urethritis: Secondary | ICD-10-CM | POA: Diagnosis not present

## 2022-05-12 DIAGNOSIS — Z7252 High risk homosexual behavior: Secondary | ICD-10-CM | POA: Diagnosis not present

## 2022-05-12 DIAGNOSIS — R300 Dysuria: Secondary | ICD-10-CM | POA: Diagnosis not present

## 2022-05-12 DIAGNOSIS — R350 Frequency of micturition: Secondary | ICD-10-CM | POA: Diagnosis not present

## 2022-05-12 MED ORDER — FLUCONAZOLE 150 MG PO TABS: ORAL_TABLET | ORAL | 0 refills | Status: DC

## 2022-05-12 NOTE — Progress Notes (Signed)
Assessment and Plan:  Clinton Jones was seen today for an episodic visit.  Diagnoses and all order for this visit:  1. High risk homosexual behavior Discussed safe sex practices  - RPR - HIV Antibody (routine testing w rflx) - HSV(herpes simplex vrs) 1+2 ab-IgG - C. trachomatis/N. gonorrhoeae RNA - fluconazole (DIFLUCAN) 150 MG tablet; Take 1 tablet (150 mg) at the sign of symptoms.  If not resolved after 72 hours may take additional 150 mg dosage.  Dispense: 3 tablet; Refill: 0  Urinary frequency  - Urinalysis, Routine w reflex microscopic - Urine Culture - RPR - HIV Antibody (routine testing w rflx) - HSV(herpes simplex vrs) 1+2 ab-IgG - C. trachomatis/N. gonorrhoeae RNA - fluconazole (DIFLUCAN) 150 MG tablet; Take 1 tablet (150 mg) at the sign of symptoms.  If not resolved after 72 hours may take additional 150 mg dosage.  Dispense: 3 tablet; Refill: 0  Dysuria Stay well hydrated to keep urinary system well flushed. May take OTC AZO for pain control. Monitor for any increase in fever, chills, N/V, abdominal pain.   Contact office if noticed.  - Urinalysis, Routine w reflex microscopic - Urine Culture - RPR - HIV Antibody (routine testing w rflx) - HSV(herpes simplex vrs) 1+2 ab-IgG - C. trachomatis/N. gonorrhoeae RNA - fluconazole (DIFLUCAN) 150 MG tablet; Take 1 tablet (150 mg) at the sign of symptoms.  If not resolved after 72 hours may take additional 150 mg dosage.  Dispense: 3 tablet; Refill: 0  Urethritis  - fluconazole (DIFLUCAN) 150 MG tablet; Take 1 tablet (150 mg) at the sign of symptoms.  If not resolved after 72 hours may take additional 150 mg dosage.  Dispense: 3 tablet; Refill: 0  Notify office for further evaluation and treatment, questions or concerns if s/s fail to improve. The risks and benefits of my recommendations, as well as other treatment options were discussed with the patient today. Questions were answered.  Further disposition pending results  of labs. Discussed med's effects and SE's.    Over 20 minutes of exam, counseling, chart review, and critical decision making was performed.   Future Appointments  Date Time Provider [place]  06/15/2022  2:00 PM Doran Stabler, MD [facility]  02/11/2023  3:00 PM Alycia Rossetti, NP [facility] None    ------------------------------------------------------------------------------------------------------------------   HPI BP 120/76   Pulse (!) 101   Temp (!) 97.5 F (36.4 C)   Ht 5\' 8"  (1.727 m)   Wt 136 lb 12.8 oz (62.1 kg)   SpO2 98%   BMI 20.80 kg/m   Patient complains of dysuria. He has had symptoms for 1 week. Patient denies back pain, congestion, cough, fever, headache, rhinitis, sorethroat, stomach ache, and penile discharge . Patient does not have a history of recurrent UTI. Patient does not have a history of pyelonephritis. He is sexually active with male partner.  Last sexual encounter was unprotected approximately 2 weeks ago.  Partner does not have current STD as stated by patient.     Past Medical History:  Diagnosis Date   Asthma    Chronic kidney disease    Depression    Headache(784.0)    IBS (irritable bowel syndrome)    IgA nephropathy with nephrotic syndrome      Allergies  Allergen Reactions   Morphine And Related Hives, Nausea And Vomiting and Rash    Current Outpatient Medications on File Prior to Visit  Medication Sig   esomeprazole (NEXIUM) 40 MG capsule Take 1 capsule (  40 mg total) by mouth 2 (two) times daily before a meal for 28 days.   ondansetron (ZOFRAN) 4 MG tablet Take 1 tablet (4 mg total) by mouth every 8 (eight) hours as needed for nausea or vomiting. (Patient not taking: Reported on 05/12/2022)   ondansetron (ZOFRAN-ODT) 4 MG disintegrating tablet Take 1 tablet (4 mg total) by mouth every 8 (eight) hours as needed for nausea or vomiting. (Patient not taking: Reported on 03/30/2022)   pantoprazole (PROTONIX) 40 MG  tablet Take 1 tablet (40 mg total) by mouth daily. (Patient not taking: Reported on 03/30/2022)   prochlorperazine (COMPAZINE) 25 MG suppository Place 1 suppository (25 mg total) rectally every 12 (twelve) hours as needed for nausea or vomiting. For breakthrough nausea and vomiting if unable to tolerate medicines by mouth (Patient not taking: Reported on 05/12/2022)   sucralfate (CARAFATE) 1 g tablet Take 1 tablet (1 g total) by mouth 4 (four) times daily. (Patient not taking: Reported on 03/30/2022)   No current facility-administered medications on file prior to visit.    ROS: all negative except what is noted in the HPI.   Physical Exam:  BP 120/76   Pulse (!) 101   Temp (!) 97.5 F (36.4 C)   Ht 5\' 8"  (1.727 m)   Wt 136 lb 12.8 oz (62.1 kg)   SpO2 98%   BMI 20.80 kg/m   General Appearance: NAD.  Awake, conversant and cooperative. Eyes: PERRLA, EOMs intact.  Sclera white.  Conjunctiva without erythema. Sinuses: No frontal/maxillary tenderness.  No nasal discharge. Nares patent.  ENT/Mouth: Ext aud canals clear.  Bilateral TMs w/DOL and without erythema or bulging. Hearing intact.  Posterior pharynx without swelling or exudate.  Tonsils without swelling or erythema.  Neck: Supple.  No masses, nodules or thyromegaly. Respiratory: Effort is regular with non-labored breathing. Breath sounds are equal bilaterally without rales, rhonchi, wheezing or stridor.  Cardio: RRR with no MRGs. Brisk peripheral pulses without edema.  Abdomen: Active BS in all four quadrants.  Soft and non-tender without guarding, rebound tenderness, hernias or masses. Lymphatics: Non tender without lymphadenopathy.  Musculoskeletal: Full ROM, 5/5 strength, normal ambulation.  No clubbing or cyanosis. Skin: Appropriate color for ethnicity. Warm without rashes, lesions, ecchymosis, ulcers.  Neuro: CN II-XII grossly normal. Normal muscle tone without cerebellar symptoms and intact sensation.   Psych: AO X 3,  appropriate  mood and affect, insight and judgment.     Darrol Jump, NP 9:53 PM [facility]

## 2022-05-12 NOTE — Patient Instructions (Signed)
Preventing Sexually Transmitted Infections, Adult Sexually transmitted infections (STIs) are spread from person to person (are contagious). They are spread, or transmitted, during sex. The sex may be vaginal, anal, or oral. STIs can be passed during sexual contact with skin, genitals, mouth, or rectum. They may spread through body fluids, such as saliva, semen, blood, vaginal mucus, and urine. STIs are very common. They can happen in people of all ages. Some common STIs are: Herpes. Hepatitis B. Chlamydia. Gonorrhea. Syphilis. Trichomoniasis. Human papillomavirus (HPV). Human immunodeficiency virus (HIV). This can cause acquired immunodeficiency syndrome (AIDS). How can STIs affect me? You may not have symptoms with an STI. Even if you do not have symptoms, you can still spread the infection to others. You also still need treatment. STIs can be treated. Some STIs can be cured. Other STIs cannot be cured and will affect you for the rest of your life. Certain STIs may: Require you to take medicine for the rest of your life. Affect your ability to have children. Increase your risk for getting other STIs. Increase your risk of getting certain conditions. These may include: Cervical cancer. Pelvic inflammatory disease (PID). Organ damage or damage to other parts of your body. This can happen if the infection spreads. Cause problems during pregnancy. STIs may be spread to the baby during pregnancy or birth. Females tend to have more severe problems from STIs than males. What can increase my risk? You may be more at risk for an STI if: You do not use protection during sex. You have more than one sex partner. You have a sex partner who has other sex partners. You have sex with a person who has an STI. You have an STI, or you have had an STI before. You inject drugs or have a sex partner who injects drugs. What actions can I take to prevent STIs? The only way to fully prevent STIs is not to  have sex of any kind. This is called practicing abstinence. If you are sexually active, you can protect yourself and others by taking these actions to lower your risk of getting an STI: Lifestyle Have only one sex partner or limit the number of sex partners you have. Avoid having sex after you have alcohol or drugs. Alcohol and drugs can affect your ability to make good choices. This can lead to risky sexual behaviors. Go to prevention counseling. This can teach you how to avoid getting an STI. Barrier protection  Use methods to stop body fluids from being exchanged between partners during sex (barrier protection). These methods can be used during oral, vaginal, or anal sex. They include: External condom, for males. Internal condom, for females. Dental dam. Use a new barrier method for every sex act from start to finish. Know that a barrier method may not protect you from all STIs. Some STIs, such as herpes, are spread through skin-to-skin contact. Avoid all sexual contact if you or a partner has herpes and there is an active flare with open sores. Birth control pills, injections, implants, and intrauterine devices (IUDs) do not protect against STIs. To prevent both STIs and pregnancy, always use a condom with a second form of birth control. General information Ask your health care provider about taking pre-exposure prophylaxis (PrEP) to prevent HIV. Stay up to date on your vaccines. Some vaccines can lower your risk of getting certain STIs. These include: Hepatitis B vaccine. HPV vaccine. This is recommended for people up to age 26. Get tested for STIs. Have your   partners get tested, too. If you test positive for an STI, follow recommendations from your health care provider about treatment. Make sure your sex partners are tested and treated as well. Where to find more information Learn more about STIs from: Centers for Disease Control and Prevention (CDC): More information about certain  STIs: cdc.gov Places to get sexual health counseling and treatment for free or at a low cost: gettested.cdc.gov U.S. Department of Health and Human Services (HHS): womenshealth.gov This information is not intended to replace advice given to you by your health care provider. Make sure you discuss any questions you have with your health care provider. Document Revised: 09/23/2021 Document Reviewed: 07/25/2021 Elsevier Patient Education  2023 [employer]  

## 2022-05-13 LAB — URINE CULTURE
MICRO NUMBER:: 14712728
Result:: NO GROWTH
SPECIMEN QUALITY:: ADEQUATE

## 2022-05-13 LAB — URINALYSIS, ROUTINE W REFLEX MICROSCOPIC
Bacteria, UA: NONE SEEN /HPF
Bilirubin Urine: NEGATIVE
Glucose, UA: NEGATIVE
Hyaline Cast: NONE SEEN /LPF
Nitrite: NEGATIVE
RBC / HPF: >=60 /HPF — AB (ref 0–2)
Specific Gravity, Urine: 1.025 (ref 1.001–1.035)
Squamous Epithelial / HPF: NONE SEEN /HPF (ref <=5)
pH: 6.5 (ref 5.0–8.0)

## 2022-05-13 LAB — C. TRACHOMATIS/N. GONORRHOEAE RNA
C. trachomatis RNA, TMA: NOT DETECTED
N. gonorrhoeae RNA, TMA: NOT DETECTED

## 2022-05-13 LAB — HIV ANTIBODY (ROUTINE TESTING W REFLEX): HIV 1&2 Ab, 4th Generation: NONREACTIVE

## 2022-05-13 LAB — HSV(HERPES SIMPLEX VRS) I + II AB-IGG
HAV 1 IGG,TYPE SPECIFIC AB: <0.9 index
HSV 2 IGG,TYPE SPECIFIC AB: <0.9 index

## 2022-05-13 LAB — MICROSCOPIC MESSAGE

## 2022-05-13 LAB — RPR: RPR Ser Ql: NONREACTIVE

## 2022-05-18 ENCOUNTER — Encounter: Payer: Self-pay | Admitting: Nurse Practitioner

## 2022-05-18 ENCOUNTER — Ambulatory Visit (INDEPENDENT_AMBULATORY_CARE_PROVIDER_SITE_OTHER): Payer: BC Managed Care – PPO | Admitting: Nurse Practitioner

## 2022-05-18 VITALS — BP 86/60 | HR 99 | Temp 98.0°F | Wt 135.0 lb

## 2022-05-18 DIAGNOSIS — R3989 Other symptoms and signs involving the genitourinary system: Secondary | ICD-10-CM | POA: Diagnosis not present

## 2022-05-18 DIAGNOSIS — A57 Chancroid: Secondary | ICD-10-CM

## 2022-05-18 DIAGNOSIS — Z7252 High risk homosexual behavior: Secondary | ICD-10-CM

## 2022-05-18 DIAGNOSIS — R300 Dysuria: Secondary | ICD-10-CM

## 2022-05-18 MED ORDER — ACYCLOVIR 5 % EX OINT: TOPICAL_OINTMENT | CUTANEOUS | 0 refills | Status: DC

## 2022-05-18 MED ORDER — CIPROFLOXACIN HCL 500 MG PO TABS: 500.0000 mg | ORAL_TABLET | Freq: Two times a day (BID) | ORAL | 0 refills | Status: AC

## 2022-05-18 NOTE — Patient Instructions (Signed)
Chancroid Chancroid, also called a soft chancre, is an infection caused by bacteria. It is easy to treat but can lead to more severe health problems if it is not treated. What are the causes? Chancroid is caused by a bacteria called Haemophilus ducreyi. You may get it if you have: Sex with an infected person. This may be vaginal, anal, or oral sex. Skin-to-skin contact with a chancroid ulcer. What increases the risk? You are more likely to get chancroid if: You do not use latex or polyurethane condoms correctly during sex. You have a new sex partner or more than one sex partner. You have an uncircumcised penis. You have exchanged sex for money or drugs, such as with a sex Insurance underwriter. What are the signs or symptoms? Chancroid first appears as painful blisters or bumps (papules). These become sacs filled with pus. Over time, the sacs burst and turn into ulcers. The ulcers may be deep and have irregular edges. Chancroid can be found on the groin, the penis, or the opening of the butt (anus). It can also be found inside your body on the lowest part of the uterus (cervix). The blisters or papules show up 3-7 days after you are exposed. The ulcers may last for months. Other symptoms include: Swollen glands (lymph nodes) in the groin. These are called buboes. Pain in the area between your hip bones (pelvis). How is this diagnosed? Chancroid may be diagnosed based on your medical history and an exam of your genital area and anus. You may also have: A swab test. In this test, a cotton swab is used to collect discharge from the blisters or papules. A pelvic exam. How is this treated? Chancroid is treated with antibiotics. If you have buboes that are very painful and become filled with fluid, you may need to have a procedure done. This may be: Needle aspiration. This is when a needle is used to drain the fluid. Incision and drainage. This is when a small incision is made to let the fluid drain  out. Follow these instructions at home: Blister or ulcer care Keep your blisters or ulcers clean. Wash the area with mild soap and cool water as told by your health care provider. Cover your blisters or ulcers with a loose bandage (dressing) or wear long sleeves and pants. Try not to rub or touch your blisters or ulcers. If you do touch them, wash your hands well with soap and water for at least 20 seconds. If soap and water are not available, use an alcohol-based hand sanitizer. General instructions Take over-the-counter and prescription medicines as told by your provider. If you were prescribed antibiotics, take them as told by your provider. Do not stop using the antibiotic even if you start to feel better. Tell anyone that you have had sex with in the last 10 days that you have chancroid. They should also be treated, even if they have no signs of infection. Do not have sex until both you and your partner are done with treatment. If told, take a cool sitz bath to help with pelvic pain. A sitz bath is a bath that you take while sitting down in water that is deep enough to cover your hips and butt. How is this prevented? You can lower your risk of getting chancroid by: Using latex or polyurethane condoms correctly. Having sex with only one partner. Asking your sex partner if they have been tested for sexually transmitted infections (STIs). Getting regular health screenings to check for STIs. Where  to find more information Centers for Disease Control and Prevention (CDC): [employer] Contact a health care provider if: You get new symptoms. You do not get better quickly with medicine. You cannot take your medicine. You have a fever or chills. This information is not intended to replace advice given to you by your health care provider. Make sure you discuss any questions you have with your health care provider. Document Revised: 11/11/2021 Document Reviewed: 11/11/2021 Elsevier Patient Education   [place].

## 2022-05-18 NOTE — Progress Notes (Signed)
Assessment and Plan:  Clinton Jones was seen today for an episodic visit.  Diagnoses and all order for this visit:  Chancroid/genital sore Start Ciprofloxacin Continue safe sex practices.  High risk homosexual behavior No current male STD screening for HPV Will obtain trichomonas swab as this was not obtained during last OV.  - Trichomonas vaginalis RNA, Ql,Males  Dysuria  - Trichomonas vaginalis RNA, Ql,Males  Notify office for further evaluation and treatment, questions or concerns if s/s fail to improve. The risks and benefits of my recommendations, as well as other treatment options were discussed with the patient today. Questions were answered.  Further disposition pending results of labs. Discussed med's effects and SE's.    Over 15 minutes of exam, counseling, chart review, and critical decision making was performed.   Future Appointments  Date Time Provider [place]  06/15/2022  2:00 PM Doran Stabler, MD [facility]  02/11/2023  3:00 PM Alycia Rossetti, NP [facility] None    ------------------------------------------------------------------------------------------------------------------   HPI BP (!) 86/60   Pulse 99   Temp 98 F (36.7 C)   Wt 135 lb (61.2 kg)   SpO2 99%   BMI 20.53 kg/m   Patient complains of dysuria with genital sore and itching. He has had symptoms for > 1 week and was seen last week in clinic 05/12/22 for dysuria only.  Sore is noted to be at lower right suprapubic neck.  STD screening was obtained to which he was negative for RPR, gonorrheae, chlamydia, HSV 1 and 2, HIV.  He was treated with Diflucan for urethritis and reports some effectiveness.  Patient denies back pain, congestion, cough, fever, headache, rhinitis, sorethroat, stomach ache, and penile discharge . He is sexually active with male partner.  Last sexual encounter was unprotected approximately 2 weeks ago.  Partner does not have current STD as stated by  patient.     Past Medical History:  Diagnosis Date   Asthma    Chronic kidney disease    Depression    Headache(784.0)    IBS (irritable bowel syndrome)    IgA nephropathy with nephrotic syndrome      Allergies  Allergen Reactions   Morphine And Related Hives, Nausea And Vomiting and Rash    Current Outpatient Medications on File Prior to Visit  Medication Sig   fluconazole (DIFLUCAN) 150 MG tablet Take 1 tablet (150 mg) at the sign of symptoms.  If not resolved after 72 hours may take additional 150 mg dosage.   esomeprazole (NEXIUM) 40 MG capsule Take 1 capsule (40 mg total) by mouth 2 (two) times daily before a meal for 28 days.   ondansetron (ZOFRAN) 4 MG tablet Take 1 tablet (4 mg total) by mouth every 8 (eight) hours as needed for nausea or vomiting. (Patient not taking: Reported on 05/12/2022)   ondansetron (ZOFRAN-ODT) 4 MG disintegrating tablet Take 1 tablet (4 mg total) by mouth every 8 (eight) hours as needed for nausea or vomiting. (Patient not taking: Reported on 03/30/2022)   pantoprazole (PROTONIX) 40 MG tablet Take 1 tablet (40 mg total) by mouth daily. (Patient not taking: Reported on 03/30/2022)   prochlorperazine (COMPAZINE) 25 MG suppository Place 1 suppository (25 mg total) rectally every 12 (twelve) hours as needed for nausea or vomiting. For breakthrough nausea and vomiting if unable to tolerate medicines by mouth (Patient not taking: Reported on 05/12/2022)   sucralfate (CARAFATE) 1 g tablet Take 1 tablet (1 g total) by mouth 4 (four) times daily. (  Patient not taking: Reported on 03/30/2022)   No current facility-administered medications on file prior to visit.    ROS: all negative except what is noted in the HPI.   Physical Exam:  BP (!) 86/60   Pulse 99   Temp 98 F (36.7 C)   Wt 135 lb (61.2 kg)   SpO2 99%   BMI 20.53 kg/m   General Appearance: NAD.  Awake, conversant and cooperative. Eyes: PERRLA, EOMs intact.  Sclera white.  Conjunctiva without  erythema. Sinuses: No frontal/maxillary tenderness.  No nasal discharge. Nares patent.  ENT/Mouth: Ext aud canals clear.  Bilateral TMs w/DOL and without erythema or bulging. Hearing intact.  Posterior pharynx without swelling or exudate.  Tonsils without swelling or erythema.  Neck: Supple.  No masses, nodules or thyromegaly. Respiratory: Effort is regular with non-labored breathing. Breath sounds are equal bilaterally without rales, rhonchi, wheezing or stridor.  Cardio: RRR with no MRGs. Brisk peripheral pulses without edema.  Abdomen: Active BS in all four quadrants.  Soft and non-tender without guarding, rebound tenderness, hernias or masses. Lymphatics: Non tender without lymphadenopathy.  Musculoskeletal: Full ROM, 5/5 strength, normal ambulation.  No clubbing or cyanosis. Skin: Round raised dry crusty vesicle type rash  that radiates along right neck of penis along suprapubic region. Surrounding skin WNL.  Warm.  Appropriate color for ethnicity. Appropriate color for ethnicity. Warm without rashes, lesions, ecchymosis, ulcers.  Neuro: CN II-XII grossly normal. Normal muscle tone without cerebellar symptoms and intact sensation.   Psych: AO X 3,  appropriate mood and affect, insight and judgment.     Darrol Jump, NP 9:21 PM [facility]

## 2022-05-19 LAB — TRICHOMONAS VAGINALIS RNA, QL,MALES: Trichomonas vaginalis RNA: NOT DETECTED

## 2022-06-15 ENCOUNTER — Encounter: Payer: Self-pay | Admitting: Gastroenterology

## 2022-06-15 ENCOUNTER — Ambulatory Visit: Payer: BC Managed Care – PPO | Admitting: Gastroenterology

## 2022-06-15 VITALS — BP 96/70 | HR 92 | Ht 68.0 in | Wt 130.0 lb

## 2022-06-15 DIAGNOSIS — K625 Hemorrhage of anus and rectum: Secondary | ICD-10-CM

## 2022-06-15 DIAGNOSIS — R933 Abnormal findings on diagnostic imaging of other parts of digestive tract: Secondary | ICD-10-CM | POA: Diagnosis not present

## 2022-06-15 DIAGNOSIS — R112 Nausea with vomiting, unspecified: Secondary | ICD-10-CM

## 2022-06-15 DIAGNOSIS — R634 Abnormal weight loss: Secondary | ICD-10-CM

## 2022-06-15 MED ORDER — PROCHLORPERAZINE MALEATE 10 MG PO TABS: 10.0000 mg | ORAL_TABLET | Freq: Every day | ORAL | 1 refills | Status: DC

## 2022-06-15 MED ORDER — PROCHLORPERAZINE 25 MG RE SUPP: 25.0000 mg | Freq: Two times a day (BID) | RECTAL | 0 refills | Status: DC | PRN

## 2022-06-15 MED ORDER — NA SULFATE-K SULFATE-MG SULF 17.5-3.13-1.6 GM/177ML PO SOLN: 1.0000 | Freq: Once | ORAL | 0 refills | Status: AC

## 2022-06-15 NOTE — Addendum Note (Signed)
Addended by: Charlie Pitter on: 06/15/2022 04:42 PM   Modules accepted: Orders

## 2022-06-15 NOTE — Patient Instructions (Addendum)
_______________________________________________________  If your blood pressure at your visit was 140/90 or greater, please contact your primary care physician to follow up on this.  _______________________________________________________  If you are age 21 or older, your body mass index should be between 23-30. Your Body mass index is 19.77 kg/m. If this is out of the aforementioned range listed, please consider follow up with your Primary Care Provider.  If you are age 73 or younger, your body mass index should be between 19-25. Your Body mass index is 19.77 kg/m. If this is out of the aformentioned range listed, please consider follow up with your Primary Care Provider.   ________________________________________________________  The [facility] providers would like to encourage you to use [facility] to communicate with providers for non-urgent requests or questions.  Due to long hold times on the telephone, sending your provider a message by [facility] may be a faster and more efficient way to get a response.  Please allow 48 business hours for a response.  Please remember that this is for non-urgent requests.  _______________________________________________________  Clinton Jones have been scheduled for an endoscopy and colonoscopy. Please follow the written instructions given to you at your visit today. Please pick up your prep supplies at the pharmacy within the next 1-3 days. If you use inhalers (even only as needed), please bring them with you on the day of your procedure.  Due to recent changes in healthcare laws, you may see the results of your imaging and laboratory studies on MyChart before your provider has had a chance to review them.  We understand that in some cases there may be results that are confusing or concerning to you. Not all laboratory results come back in the same time frame and the provider may be waiting for multiple results in order to interpret others.  Please give [place] 48 hours in  order for your provider to thoroughly review all the results before contacting the office for clarification of your results.    It was a pleasure to see you today!  Thank you for trusting me with your gastrointestinal care!

## 2022-06-15 NOTE — Progress Notes (Signed)
[facility] Progress Note  Chief Complaint:  Chief Complaint  Patient presents with   Hospitalization Follow-up    No issues related   Emesis    When waking up can have some nausea and also can happen doing the day but does have to vomit     Subjective  History: I first saw Clinton Jones in clinic on 03/30/2022 for nausea and vomiting with significant weight loss.  He opted for trial of antiemetics and wanted to hold off on endoscopy at that point. Was in the ED on 04/16/2022 with lower abdominal pain and rectal bleeding, CT scan suggested colitis, and he was given a course of antibiotics. He was also seen by primary care on 05/18/2022 for a chancroid penile lesion treated with ciprofloxacin, and swab negative for GC/chlamydia and trichomonas.  Today, he reports feeling well overall. We r reviewed his last ED visit on 04-16-22 where he was experiencing blood in stool and had fecal urgency. He states his symptoms had persisted for a week prior to his visit and states that he didn't have a BM for 2-3 days, which is normal for him. He states that his weight continues to drop but states he's not worried about it. He reports that his dietitian considered a possible diagnosis of anorexia. He reports stopping his antipsychotic medication and possibly contributes that to his weight loss.   He states that he is concerned about feeling of nausea which he typically experience it in the morning. He states that he never took compazine 25 mg which was prescribed last visit as he had lost it.   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   He reports smoking marijuana and vaping most days morning and evening.   ROS: Review of Systems  Constitutional:  Positive for unexpected weight change (weight loss). Negative for appetite change and fever.  HENT:  Negative for trouble swallowing.   Respiratory:  Negative for cough and shortness of breath.   Cardiovascular:  Negative for chest pain.   Gastrointestinal:  Positive for anal bleeding, nausea and rectal pain. Negative for abdominal distention, abdominal pain, blood in stool, constipation, diarrhea and vomiting.  Genitourinary:  Negative for dysuria.  Musculoskeletal:  Negative for back pain.  Skin:  Negative for rash.  Neurological:  Negative for weakness.  All other systems reviewed and are negative.  The patient's Past Medical, Family and Social History were reviewed and are on file in the EMR.  Objective:  Med list reviewed  Current Outpatient Medications:    esomeprazole (NEXIUM) 40 MG capsule, Take 1 capsule (40 mg total) by mouth 2 (two) times daily before a meal for 28 days., Disp: 28 capsule, Rfl: 1   fluconazole (DIFLUCAN) 150 MG tablet, Take 1 tablet (150 mg) at the sign of symptoms.  If not resolved after 72 hours may take additional 150 mg dosage., Disp: 3 tablet, Rfl: 0   ondansetron (ZOFRAN) 4 MG tablet, Take 1 tablet (4 mg total) by mouth every 8 (eight) hours as needed for nausea or vomiting. (Patient not taking: Reported on 05/12/2022), Disp: 30 tablet, Rfl: 1   ondansetron (ZOFRAN-ODT) 4 MG disintegrating tablet, Take 1 tablet (4 mg total) by mouth every 8 (eight) hours as needed for nausea or vomiting. (Patient not taking: Reported on 03/30/2022), Disp: 20 tablet, Rfl: 0   pantoprazole (PROTONIX) 40 MG tablet, Take 1 tablet (40 mg total) by mouth daily. (Patient not taking: Reported on 03/30/2022), Disp: 30 tablet, Rfl:  0   prochlorperazine (COMPAZINE) 25 MG suppository, Place 1 suppository (25 mg total) rectally every 12 (twelve) hours as needed for nausea or vomiting. For breakthrough nausea and vomiting if unable to tolerate medicines by mouth (Patient not taking: Reported on 05/12/2022), Disp: 12 suppository, Rfl: 0   sucralfate (CARAFATE) 1 g tablet, Take 1 tablet (1 g total) by mouth 4 (four) times daily. (Patient not taking: Reported on 03/30/2022), Disp: 120 tablet, Rfl: 1   Vital signs in last 24  hrs: There were no vitals filed for this visit. Wt Readings from Last 3 Encounters:  05/18/22 135 lb (61.2 kg)  05/12/22 136 lb 12.8 oz (62.1 kg)  04/16/22 140 lb (63.5 kg)    Physical Exam  General: well-appearing   Eyes: sclera anicteric, no redness ENT: oral mucosa moist without lesions, no cervical or supraclavicular lymphadenopathy CV: RRR, no JVD, no peripheral edema Resp: clear to auscultation bilaterally, normal RR and effort noted GI: soft, no tenderness, with active bowel sounds. No guarding or palpable organomegaly noted. Skin; warm and dry, no rash or jaundice noted Neuro: awake, alert and oriented x 3. Normal gross motor function and fluent speech   Labs:     Latest Ref Rng & Units 04/16/2022    9:49 AM 12/29/2021    3:39 PM 02/10/2021    3:52 PM  CBC  WBC 4.0 - 10.5 K/uL 6.7  9.0  7.0   Hemoglobin 13.0 - 17.0 g/dL 16.1  09.6  04.5   Hematocrit 39.0 - 52.0 % 46.5  47.2  41.5   Platelets 150 - 400 K/uL 260  328  270       Latest Ref Rng & Units 04/16/2022    9:49 AM 12/29/2021    3:39 PM 02/10/2021    3:52 PM  CMP  Glucose 70 - 99 mg/dL 97  95  80   BUN 6 - 20 mg/dL 5  10  9    Creatinine 0.61 - 1.24 mg/dL 4.09  8.11  9.14   Sodium 135 - 145 mmol/L 137  140  139   Potassium 3.5 - 5.1 mmol/L 4.0  4.0  4.5   Chloride 98 - 111 mmol/L 101  105  102   CO2 22 - 32 mmol/L 27  27  30    Calcium 8.9 - 10.3 mg/dL 9.1  9.2  9.4   Total Protein 6.5 - 8.1 g/dL 6.9  7.8  6.6   Total Bilirubin 0.3 - 1.2 mg/dL 1.1  0.8  0.4   Alkaline Phos 38 - 126 U/L 96  127    AST 15 - 41 U/L 14  17  12    ALT 0 - 44 U/L 11  17  7      ___________________________________________ Radiologic studies: CLINICAL DATA:  LLQ abdominal pain lower abd pain, rectal bleeding   EXAM: CT ABDOMEN AND PELVIS WITH CONTRAST   TECHNIQUE: Multidetector CT imaging of the abdomen and pelvis was performed using the standard protocol following bolus administration of intravenous contrast.   RADIATION  DOSE REDUCTION: This exam was performed according to the departmental dose-optimization program which includes automated exposure control, adjustment of the mA and/or kV according to patient size and/or use of iterative reconstruction technique.   CONTRAST:  70mL OMNIPAQUE IOHEXOL 350 MG/ML SOLN   COMPARISON:  11/29/2007   FINDINGS: Lower chest: Included lung bases are clear.  Heart size is normal.   Hepatobiliary: No focal liver abnormality is seen. No gallstones, gallbladder wall thickening, or  biliary dilatation.   Pancreas: Unremarkable. No pancreatic ductal dilatation or surrounding inflammatory changes.   Spleen: Normal in size without focal abnormality.   Adrenals/Urinary Tract: Unremarkable adrenal glands. Kidneys enhance symmetrically without focal lesion, stone, or hydronephrosis. Ureters are nondilated. Urinary bladder wall appears mildly thickened, which may be accentuated by under distension.   Stomach/Bowel: Stomach is within normal limits. Appendix appears normal (series 6, image 41). No dilated loops of bowel. Mild long segment wall thickening involving the distal descending colon and sigmoid colon. No fat stranding or pericolonic fluid.   Vascular/Lymphatic: No significant vascular findings are present. No enlarged abdominal or pelvic lymph nodes.   Reproductive: Prostate is unremarkable.   Other: Trace free fluid within the pelvis is likely reactive. No abdominopelvic fluid collection. No pneumoperitoneum. No abdominal wall hernia.   Musculoskeletal: No acute or significant osseous findings. Mild degenerative disc disease at L5-S1.   IMPRESSION: 1. Mild long segment wall thickening involving the distal descending colon and sigmoid colon, suggestive of a mild infectious or inflammatory colitis. 2. Urinary bladder wall appears mildly thickened, which may be accentuated by under distension. Correlate with urinalysis to exclude cystitis. 3. Trace free  fluid within the pelvis is likely reactive. 4. Normal appendix. 5. Mild degenerative disc disease at L5-S1.     Electronically Signed   By: Duanne Guess D.O.   On: 04/16/2022 15:20   ______________________  CT images above personally reviewed.  Limited by lack of oral contrast. - H. Danis, MD ____________________________________________ Other:   _____________________________________________ Assessment & Plan  Assessment: Nausea and vomiting, unspecified vomiting type  Rectal bleeding  Abnormal finding on GI tract imaging  Loss of weight  Ongoing nausea and, cause unclear.  Consider H. 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 Recent episode of lower abdominal/rectal pain and bleeding with a CT scan suggesting colitis.  He has recovered from that episode, so could have been infectious but he was not having diarrhea at that time.  He does not seem to have chronic lower digestive symptoms to suggest Crohn's and it would be very unusual for young otherwise healthy manage ischemic colitis, especially given the area noted on the CT scan. He does not seem particularly concerned about the weight loss, but I certainly am. Plan: -colonoscopy and EGD.  He was agreeable after discussion of procedure and risks. I once again asked him to very seriously considering cessation of marijuana at least for the time being because it is confusing the diagnostic picture of his nausea and vomiting.  -Compazine 10 mg by mouth nightly  33 minutes were spent on this encounter (including chart review, history/exam, counseling/coordination of care, and documentation) > 50% of that time was spent on counseling and coordination of care.   Clinton Jones

## 2022-06-15 NOTE — Progress Notes (Deleted)
[facility] Progress Note  Chief Complaint: ***  Subjective  History: I first saw Clinton Jones in clinic on 03/30/2022 for nausea and vomiting with significant weight loss.  He opted for trial of antiemetics and wanted to hold off on endoscopy at that point. Was in the ED on 04/16/2022 with lower abdominal pain and rectal bleeding, CT scan suggested colitis, and he was given a course of antibiotics. He was also seen by primary care on 05/18/2022 for a chancroid penile lesion treated with ciprofloxacin, and swab negative for GC/chlamydia and trichomonas.  ***  ROS: Cardiovascular:  no chest pain Respiratory: no dyspnea  The patient's Past Medical, Family and Social History were reviewed and are on file in the EMR.  Objective:  Med list reviewed  Current Outpatient Medications:    esomeprazole (NEXIUM) 40 MG capsule, Take 1 capsule (40 mg total) by mouth 2 (two) times daily before a meal for 28 days., Disp: 28 capsule, Rfl: 1   ondansetron (ZOFRAN) 4 MG tablet, Take 1 tablet (4 mg total) by mouth every 8 (eight) hours as needed for nausea or vomiting. (Patient not taking: Reported on 05/12/2022), Disp: 30 tablet, Rfl: 1   ondansetron (ZOFRAN-ODT) 4 MG disintegrating tablet, Take 1 tablet (4 mg total) by mouth every 8 (eight) hours as needed for nausea or vomiting. (Patient not taking: Reported on 03/30/2022), Disp: 20 tablet, Rfl: 0   pantoprazole (PROTONIX) 40 MG tablet, Take 1 tablet (40 mg total) by mouth daily. (Patient not taking: Reported on 03/30/2022), Disp: 30 tablet, Rfl: 0   prochlorperazine (COMPAZINE) 25 MG suppository, Place 1 suppository (25 mg total) rectally every 12 (twelve) hours as needed for nausea or vomiting. For breakthrough nausea and vomiting if unable to tolerate medicines by mouth (Patient not taking: Reported on 05/12/2022), Disp: 12 suppository, Rfl: 0   sucralfate (CARAFATE) 1 g tablet, Take 1 tablet (1 g total) by mouth 4 (four) times daily. (Patient not taking:  Reported on 03/30/2022), Disp: 120 tablet, Rfl: 1   Vital signs in last 24 hrs: Vitals:   06/15/22 1414  BP: 96/70  Pulse: 92   Wt Readings from Last 3 Encounters:  06/15/22 130 lb (59 kg)  05/18/22 135 lb (61.2 kg)  05/12/22 136 lb 12.8 oz (62.1 kg)    Physical Exam  *** HEENT: sclera anicteric, oral mucosa moist without lesions Neck: supple, no thyromegaly, JVD or lymphadenopathy Cardiac: ***,  no peripheral edema Pulm: clear to auscultation bilaterally, normal RR and effort noted Abdomen: soft, *** tenderness, with active bowel sounds. No guarding or palpable hepatosplenomegaly. Skin; warm and dry, no jaundice or rash  Labs:     Latest Ref Rng & Units 04/16/2022    9:49 AM 12/29/2021    3:39 PM 02/10/2021    3:52 PM  CBC  WBC 4.0 - 10.5 K/uL 6.7  9.0  7.0   Hemoglobin 13.0 - 17.0 g/dL 40.9  81.1  91.4   Hematocrit 39.0 - 52.0 % 46.5  47.2  41.5   Platelets 150 - 400 K/uL 260  328  270       Latest Ref Rng & Units 04/16/2022    9:49 AM 12/29/2021    3:39 PM 02/10/2021    3:52 PM  CMP  Glucose 70 - 99 mg/dL 97  95  80   BUN 6 - 20 mg/dL Creatinine 0.61 - 1.24 mg/dL 7.82  9.56  2.13   Sodium 135 -  145 mmol/L 137  140  139   Potassium 3.5 - 5.1 mmol/L 4.0  4.0  4.5   Chloride 98 - 111 mmol/L 101  105  102   CO2 22 - 32 mmol/L Calcium 8.9 - 10.3 mg/dL 9.1  9.2  9.4   Total Protein 6.5 - 8.1 g/dL 6.9  7.8  6.6   Total Bilirubin 0.3 - 1.2 mg/dL 1.1  0.8  0.4   Alkaline Phos 38 - 126 U/L 96  127    AST 15 - 41 U/L ALT 0 - 44 U/L ___________________________________________ Radiologic studies: CLINICAL DATA:  LLQ abdominal pain lower abd pain, rectal bleeding   EXAM: CT ABDOMEN AND PELVIS WITH CONTRAST   TECHNIQUE: Multidetector CT imaging of the abdomen and pelvis was performed using the standard protocol following bolus administration of intravenous contrast.   RADIATION DOSE REDUCTION: This exam was  performed according to the departmental dose-optimization program which includes automated exposure control, adjustment of the mA and/or kV according to patient size and/or use of iterative reconstruction technique.   CONTRAST:  70mL OMNIPAQUE IOHEXOL 350 MG/ML SOLN   COMPARISON:  11/29/2007   FINDINGS: Lower chest: Included lung bases are clear.  Heart size is normal.   Hepatobiliary: No focal liver abnormality is seen. No gallstones, gallbladder wall thickening, or biliary dilatation.   Pancreas: Unremarkable. No pancreatic ductal dilatation or surrounding inflammatory changes.   Spleen: Normal in size without focal abnormality.   Adrenals/Urinary Tract: Unremarkable adrenal glands. Kidneys enhance symmetrically without focal lesion, stone, or hydronephrosis. Ureters are nondilated. Urinary bladder wall appears mildly thickened, which may be accentuated by under distension.   Stomach/Bowel: Stomach is within normal limits. Appendix appears normal (series 6, image 41). No dilated loops of bowel. Mild long segment wall thickening involving the distal descending colon and sigmoid colon. No fat stranding or pericolonic fluid.   Vascular/Lymphatic: No significant vascular findings are present. No enlarged abdominal or pelvic lymph nodes.   Reproductive: Prostate is unremarkable.   Other: Trace free fluid within the pelvis is likely reactive. No abdominopelvic fluid collection. No pneumoperitoneum. No abdominal wall hernia.   Musculoskeletal: No acute or significant osseous findings. Mild degenerative disc disease at L5-S1.   IMPRESSION: 1. Mild long segment wall thickening involving the distal descending colon and sigmoid colon, suggestive of a mild infectious or inflammatory colitis. 2. Urinary bladder wall appears mildly thickened, which may be accentuated by under distension. Correlate with urinalysis to exclude cystitis. 3. Trace free fluid within the pelvis is  likely reactive. 4. Normal appendix. 5. Mild degenerative disc disease at L5-S1.     Electronically Signed   By: Duanne Guess D.O.   On: 04/16/2022 15:20   ______________________  CT images above personally reviewed.  Limited by lack of oral contrast. - H. Myrtie Neither, MD ____________________________________________ Other:   _____________________________________________ Assessment & Plan  Assessment: No diagnosis found.    Plan:   *** minutes were spent on this encounter (including chart review, history/exam, counseling/coordination of care, and documentation) > 50% of that time was spent on counseling and coordination of care.   Clinton Jones III

## 2022-06-22 ENCOUNTER — Encounter: Payer: Self-pay | Admitting: Gastroenterology

## 2022-06-24 ENCOUNTER — Other Ambulatory Visit (HOSPITAL_BASED_OUTPATIENT_CLINIC_OR_DEPARTMENT_OTHER): Payer: Self-pay

## 2022-06-24 ENCOUNTER — Encounter (HOSPITAL_BASED_OUTPATIENT_CLINIC_OR_DEPARTMENT_OTHER): Payer: Self-pay | Admitting: Student

## 2022-06-24 ENCOUNTER — Ambulatory Visit (HOSPITAL_BASED_OUTPATIENT_CLINIC_OR_DEPARTMENT_OTHER): Payer: BC Managed Care – PPO | Admitting: Student

## 2022-06-24 DIAGNOSIS — S29012A Strain of muscle and tendon of back wall of thorax, initial encounter: Secondary | ICD-10-CM | POA: Diagnosis not present

## 2022-06-24 MED ORDER — METHOCARBAMOL 500 MG PO TABS
500.0000 mg | ORAL_TABLET | Freq: Four times a day (QID) | ORAL | 0 refills | Status: AC
  Filled 2022-06-24: qty 40, 10d supply, fill #0

## 2022-06-24 NOTE — Progress Notes (Signed)
Chief Complaint: Back pain     History of Present Illness:    Clinton Jones is a 22 y.o. male presenting for evaluation of back pain.  He said this started this morning immediately after an episode of vomiting.  It is located in his left upper back near his shoulder blade and does not radiate.  He reports that the pain is a 7/10.  He has not taken anything for pain.  No prior history of back pain or injury.  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   Surgical History:   None  PMH/PSH/Family History/Social History/Meds/Allergies:    Past Medical History:  Diagnosis Date   Asthma    Chronic kidney disease    Depression    Headache(784.0)    IBS (irritable bowel syndrome)    IgA nephropathy with nephrotic syndrome    Past Surgical History:  Procedure Laterality Date   ADENOIDECTOMY Bilateral 02/23/2002   CIRCUMCISION  2003   COLONOSCOPY WITH ESOPHAGOGASTRODUODENOSCOPY (EGD)     In [place] at least 5 years ago   TYMPANOSTOMY Bilateral 02/23/2002   Social History   Socioeconomic History   Marital status: Single    Spouse name: Not on file   Number of children: Not on file   Years of education: Not on file   Highest education level: 11th grade  Occupational History   Occupation: Control and instrumentation engineer: [employer]  Tobacco Use   Smoking status: Every Day    Types: E-cigarettes    Start date: 01/14/2017   Smokeless tobacco: Current   Tobacco comments:    Vape everyday   Vaping Use   Vaping Use: Every day  Substance and Sexual Activity   Alcohol use: No   Drug use: Yes    Types: Marijuana   Sexual activity: Not on file  Other Topics Concern   Not on file  Social History Narrative   While [place] attended 8th grade at [employer] doing well living with parents and brother, Daelyn in 12th grade at [employer] lacks English assignments to graduate which mother considers he  could make up but he refuses until next year or never finishing high school.  He has a job the last couple of weeks appreciating the pay but not enjoying the work.  He has recent cannabis charges by the police on his car which father expects will establish a criminal record while patient doubts that he will be prosecuted.  Demario is inconsistent in his history varying accounts of substance use, self-harm, depression and anxiety.  Mother suggest GI work-up in the past for IBS may have been more for symptoms and consequences of bulimia.  Cannabis is 3 or 4 times daily approximately $200 weekly and with cigarettes and vaping tobacco of several years that calm him as does cutting his thighs which may have occurred a couple of years ago or couple months ago.  He drives effectively disapproving that maternal grandmother took his bedroom at mother's home when her father died.  Patient has run away and has moved to live with a cousin but plans to live with boyfriend at least by 21 years of age in 2 months.   Social Determinants of Health   Financial Resource Strain: Not on file  Food Insecurity: Not  on file  Transportation Needs: Not on file  Physical Activity: Not on file  Stress: Not on file  Social Connections: Not on file   Family History  Problem Relation Age of Onset   Migraines Mother    Allergic rhinitis Mother    Allergic rhinitis Father    Hypertension Father    Nephrotic syndrome Father    Depression Father    Allergic rhinitis Brother    Asthma Brother    Hypertension Maternal Grandmother    Heart attack Maternal Grandmother    Pancreatic cancer Maternal Grandfather    Hypertension Maternal Grandfather    Heart attack Maternal Grandfather    Depression Paternal Grandfather    Colon cancer Neg Hx    Esophageal cancer Neg Hx    Allergies  Allergen Reactions   Morphine And Related Hives, Nausea And Vomiting and Rash   Current Outpatient Medications  Medication Sig Dispense Refill    methocarbamol (ROBAXIN) 500 MG tablet Take 1 tablet (500 mg total) by mouth 4 (four) times daily for 10 days. 40 tablet 0   esomeprazole (NEXIUM) 40 MG capsule Take 1 capsule (40 mg total) by mouth 2 (two) times daily before a meal for 28 days. 28 capsule 1   ondansetron (ZOFRAN) 4 MG tablet Take 1 tablet (4 mg total) by mouth every 8 (eight) hours as needed for nausea or vomiting. (Patient not taking: Reported on 05/12/2022) 30 tablet 1   ondansetron (ZOFRAN-ODT) 4 MG disintegrating tablet Take 1 tablet (4 mg total) by mouth every 8 (eight) hours as needed for nausea or vomiting. (Patient not taking: Reported on 03/30/2022) 20 tablet 0   pantoprazole (PROTONIX) 40 MG tablet Take 1 tablet (40 mg total) by mouth daily. (Patient not taking: Reported on 03/30/2022) 30 tablet 0   prochlorperazine (COMPAZINE) 10 MG tablet Take 1 tablet (10 mg total) by mouth at bedtime. 30 tablet 1   prochlorperazine (COMPAZINE) 25 MG suppository Place 1 suppository (25 mg total) rectally every 12 (twelve) hours as needed for nausea or vomiting. For breakthrough nausea and vomiting if unable to tolerate medicines by mouth 12 suppository 0   sucralfate (CARAFATE) 1 g tablet Take 1 tablet (1 g total) by mouth 4 (four) times daily. (Patient not taking: Reported on 03/30/2022) 120 tablet 1   No current facility-administered medications for this visit.   No results found.  Review of Systems:   A ROS was performed including pertinent positives and negatives as documented in the HPI.  Physical Exam :   Constitutional: NAD and appears stated age Neurological: Alert and oriented Psych: Appropriate affect and cooperative There were no vitals taken for this visit.   Comprehensive Musculoskeletal Exam:    No tenderness over thoracic or lumbar vertebra.  Patient does have some muscular tightness and tenderness along the left thoracic paraspinal muscles.  Some tenderness throughout left scapular region.  Full shoulder range of  motion bilaterally.  No CVA tenderness.  Imaging:     I personally reviewed and interpreted the radiographs.   Assessment:   22 y.o. male presenting with upper back pain after vomiting earlier this morning.  Overall his symptoms are consistent with a muscular strain therefore I would like to trial some Robaxin for symptomatic relief.  We also discussed the benefit of physical therapy however patient would like to hold at this time to see if this resolves on its own.  Recommended use of heat, ice, and Tylenol as needed.  Will plan to see back  as needed and can send patient for physical therapy if symptoms do not improve after a few weeks.  Plan :    -Return to clinic as needed     I personally saw and evaluated the patient, and participated in the management and treatment plan.  Hazle Nordmann, PA-C Orthopedics  This document was dictated using Conservation officer, historic buildings. A reasonable attempt at proof reading has been made to minimize errors.

## 2022-06-29 ENCOUNTER — Ambulatory Visit (AMBULATORY_SURGERY_CENTER): Payer: BC Managed Care – PPO | Admitting: Gastroenterology

## 2022-06-29 ENCOUNTER — Encounter: Payer: Self-pay | Admitting: Gastroenterology

## 2022-06-29 VITALS — BP 118/80 | HR 59 | Temp 97.7°F | Resp 15 | Ht 68.0 in | Wt 130.0 lb

## 2022-06-29 DIAGNOSIS — R634 Abnormal weight loss: Secondary | ICD-10-CM

## 2022-06-29 DIAGNOSIS — R112 Nausea with vomiting, unspecified: Secondary | ICD-10-CM | POA: Diagnosis not present

## 2022-06-29 DIAGNOSIS — R933 Abnormal findings on diagnostic imaging of other parts of digestive tract: Secondary | ICD-10-CM

## 2022-06-29 DIAGNOSIS — K921 Melena: Secondary | ICD-10-CM

## 2022-06-29 DIAGNOSIS — K625 Hemorrhage of anus and rectum: Secondary | ICD-10-CM

## 2022-06-29 MED ORDER — SODIUM CHLORIDE 0.9 % IV SOLN
500.0000 mL | Freq: Once | INTRAVENOUS | Status: DC
Start: 2022-06-29 — End: 2022-06-29

## 2022-06-29 NOTE — Op Note (Signed)
[facility] Patient Name: Clinton Jones Procedure Date: 06/29/2022 7:19 AM MRN: [id number] Endoscopist: Clinton Jones , MD, [id number] Age: 22 Referring MD:  Date of Birth: Mar 25, 2000 Gender: Male Account #: [id number] Procedure:                Upper GI endoscopy Indications:              Nausea with vomiting, Weight loss Medicines:                Monitored Anesthesia Care Procedure:                Pre-Anesthesia Assessment:                           - Prior to the procedure, a History and Physical                            was performed, and patient medications and                            allergies were reviewed. The patient's tolerance of                            previous anesthesia was also reviewed. The risks                            and benefits of the procedure and the sedation                            options and risks were discussed with the patient.                            All questions were answered, and informed consent                            was obtained. Prior Anticoagulants: The patient has                            taken no anticoagulant or antiplatelet agents. ASA                            Grade Assessment: II - A patient with mild systemic                            disease. After reviewing the risks and benefits,                            the patient was deemed in satisfactory condition to                            undergo the procedure.                           After obtaining informed consent, the endoscope was  passed under direct vision. Throughout the                            procedure, the patient's blood pressure, pulse, and                            oxygen saturations were monitored continuously. The                            Olympus Scope [id number] was introduced through the                            mouth, and advanced to the second part of duodenum.                            The upper GI  endoscopy was accomplished without                            difficulty. The patient tolerated the procedure                            well. Scope In: Scope Out: Findings:                 The larynx was normal.                           The esophagus was normal.                           Normal mucosa was found in the entire examined                            stomach. Biopsies were taken with a cold forceps                            for histology. (Antrum and body in same pathology                            jar to rule out H. pylori)                           The cardia and gastric fundus were normal on                            retroflexion.                           The examined duodenum was normal. Biopsies for                            histology were taken with a cold forceps for                            evaluation of celiac disease. Complications:  No immediate complications. Estimated Blood Loss:     Estimated blood loss was minimal. Impression:               - Normal larynx.                           - Normal esophagus.                           - Normal mucosa was found in the entire stomach.                            Biopsied.                           - Normal examined duodenum. Biopsied. Recommendation:           - Patient has a contact number available for                            emergencies. The signs and symptoms of potential                            delayed complications were discussed with the                            patient. Return to normal activities tomorrow.                            Written discharge instructions were provided to the                            patient.                           - Resume previous diet.                           - Continue present medications.                           - Await pathology results.                           - See the other procedure note for documentation of                             additional recommendations.                           - Please make every effort to stop using marijuana                            because it is possibly related to these symptoms (?                            cannabis hyperemesis syndrome ?). [name] L. Myrtie Neither, MD 06/29/2022 8:05:40  AM This report has been signed electronically.

## 2022-06-29 NOTE — Op Note (Signed)
[facility] Patient Name: Clinton Jones Procedure Date: 06/29/2022 7:19 AM MRN: [id number] Endoscopist: Sherilyn Cooter L. Myrtie Neither , MD, [id number] Age: 22 Referring MD:  Date of Birth: 11-Feb-2001 Gender: Male Account #: [id number] Procedure:                Colonoscopy Indications:              Hematochezia, Abnormal CT of the GI tract, Diarrhea                            (intermittent)                           see recent office note for clinical details                           CTAP during ED visit for acute onset lower                            abdominal pain and hematochezia showing left-sided                            colitis Medicines:                Monitored Anesthesia Care Procedure:                Pre-Anesthesia Assessment:                           - Prior to the procedure, a History and Physical                            was performed, and patient medications and                            allergies were reviewed. The patient's tolerance of                            previous anesthesia was also reviewed. The risks                            and benefits of the procedure and the sedation                            options and risks were discussed with the patient.                            All questions were answered, and informed consent                            was obtained. Prior Anticoagulants: The patient has                            taken no anticoagulant or antiplatelet agents. ASA  Grade Assessment: II - A patient with mild systemic                            disease. After reviewing the risks and benefits,                            the patient was deemed in satisfactory condition to                            undergo the procedure.                           After obtaining informed consent, the colonoscope                            was passed under direct vision. Throughout the                            procedure, the patient's  blood pressure, pulse, and                            oxygen saturations were monitored continuously. The                            [id number] Colonoscope [id number] was introduced                            through the anus and advanced to the the terminal                            ileum, with identification of the appendiceal                            orifice and IC valve. The colonoscopy was performed                            without difficulty. The patient tolerated the                            procedure well. The quality of the bowel                            preparation was excellent. The terminal ileum,                            ileocecal valve, appendiceal orifice, and rectum                            were photographed. Scope In: 7:46:57 AM Scope Out: 7:58:22 AM Scope Withdrawal Time: 0 hours 8 minutes 50 seconds  Total Procedure Duration: 0 hours 11 minutes 25 seconds  Findings:                 The perianal and digital rectal examinations were  normal.                           The terminal ileum appeared normal.                           Normal mucosa was found in the entire colon.                            Biopsies for histology were taken with a cold                            forceps from the ascending colon, transverse colon                            and sigmoid colon for evaluation of microscopic                            colitis.                           The entire examined colon appeared normal on direct                            and retroflexion views. Complications:            No immediate complications. Estimated Blood Loss:     Estimated blood loss was minimal. Impression:               - The examined portion of the ileum was normal.                           - Normal mucosa in the entire examined colon.                            Biopsied.                           - The entire examined colon is normal on direct and                             retroflexion views.                           Suspect that ED visit clinical scenario and imaging                            findings were acute infectious colitis that is now                            resolved. Recommendation:           - Patient has a contact number available for                            emergencies. The signs and symptoms of potential  delayed complications were discussed with the                            patient. Return to normal activities tomorrow.                            Written discharge instructions were provided to the                            patient.                           - Resume previous diet.                           - Continue present medications.                           - See the other procedure note for documentation of                            additional recommendations. Lowery Paullin L. Myrtie Neither, MD 06/29/2022 8:09:25 AM This report has been signed electronically.

## 2022-06-29 NOTE — Progress Notes (Signed)
Called to room to assist during endoscopic procedure.  Patient ID and intended procedure confirmed with present staff. Received instructions for my participation in the procedure from the performing physician.  

## 2022-06-29 NOTE — Progress Notes (Signed)
No changes to clinical history since GI office visit on 06/15/22.  The patient is appropriate for an endoscopic procedure in the ambulatory setting.  - Pritika Alvarez Danis, MD    

## 2022-06-29 NOTE — Progress Notes (Signed)
Pt resting comfortably. VSS. Airway intact. SBAR complete to RN. All questions answered.   

## 2022-06-29 NOTE — Patient Instructions (Signed)
Discharge instructions given. Biopsies taken. Resume previous medications. YOU HAD AN ENDOSCOPIC PROCEDURE TODAY AT THE [facility]:   Refer to the procedure report that was given to you for any specific questions about what was found during the examination.  If the procedure report does not answer your questions, please call your gastroenterologist to clarify.  If you requested that your care partner not be given the details of your procedure findings, then the procedure report has been included in a sealed envelope for you to review at your convenience later.  YOU SHOULD EXPECT: Some feelings of bloating in the abdomen. Passage of more gas than usual.  Walking can help get rid of the air that was put into your GI tract during the procedure and reduce the bloating. If you had a lower endoscopy (such as a colonoscopy or flexible sigmoidoscopy) you may notice spotting of blood in your stool or on the toilet paper. If you underwent a bowel prep for your procedure, you may not have a normal bowel movement for a few days.  Please Note:  You might notice some irritation and congestion in your nose or some drainage.  This is from the oxygen used during your procedure.  There is no need for concern and it should clear up in a day or so.  SYMPTOMS TO REPORT IMMEDIATELY:  Following lower endoscopy (colonoscopy or flexible sigmoidoscopy):  Excessive amounts of blood in the stool  Significant tenderness or worsening of abdominal pains  Swelling of the abdomen that is new, acute  Fever of 100F or higher  Following upper endoscopy (EGD)  Vomiting of blood or coffee ground material  New chest pain or pain under the shoulder blades  Painful or persistently difficult swallowing  New shortness of breath  Fever of 100F or higher  Black, tarry-looking stools  For urgent or emergent issues, a gastroenterologist can be reached at any hour by calling [phone]. Do not use MyChart messaging  for urgent concerns.    DIET:  We do recommend a small meal at first, but then you may proceed to your regular diet.  Drink plenty of fluids but you should avoid alcoholic beverages for 24 hours.  ACTIVITY:  You should plan to take it easy for the rest of today and you should NOT DRIVE or use heavy machinery until tomorrow (because of the sedation medicines used during the test).    FOLLOW UP: Our staff will call the number listed on your records the next business day following your procedure.  We will call around 7:15- 8:00 am to check on you and address any questions or concerns that you may have regarding the information given to you following your procedure. If we do not reach you, we will leave a message.     If any biopsies were taken you will be contacted by phone or by letter within the next 1-3 weeks.  Please call us at [phone] if you have not heard about the biopsies in 3 weeks.    SIGNATURES/CONFIDENTIALITY: You and/or your care partner have signed paperwork which will be entered into your electronic medical record.  These signatures attest to the fact that that the information above on your After Visit Summary has been reviewed and is understood.  Full responsibility of the confidentiality of this discharge information lies with you and/or your care-partner. 

## 2022-06-30 ENCOUNTER — Telehealth: Payer: Self-pay | Admitting: *Deleted

## 2022-06-30 NOTE — Telephone Encounter (Signed)
  Follow up Call-     06/29/2022    7:17 AM  Call back number  Post procedure Call Back phone  # [phone]  Permission to leave phone message Yes     Patient questions:  Do you have a fever, pain , or abdominal swelling? No. Pain Score  0 *  Have you tolerated food without any problems? Yes.    Have you been able to return to your normal activities? Yes.    Do you have any questions about your discharge instructions: Diet   No. Medications  No. Follow up visit  No.  Do you have questions or concerns about your Care? No.  Actions: * If pain score is 4 or above: No action needed, pain <4.

## 2022-07-02 ENCOUNTER — Encounter: Payer: Self-pay | Admitting: Gastroenterology

## 2022-07-03 ENCOUNTER — Other Ambulatory Visit: Payer: Self-pay

## 2022-07-03 DIAGNOSIS — R634 Abnormal weight loss: Secondary | ICD-10-CM

## 2022-07-03 DIAGNOSIS — R112 Nausea with vomiting, unspecified: Secondary | ICD-10-CM

## 2022-07-15 ENCOUNTER — Encounter: Payer: BC Managed Care – PPO | Admitting: Gastroenterology

## 2022-07-17 ENCOUNTER — Telehealth: Payer: Self-pay | Admitting: Gastroenterology

## 2022-07-17 NOTE — Telephone Encounter (Signed)
Hi [place],  Received this letter from [facility] regarding his GASTRIC EMPTYING. The letter is scanned into patient's chart under media:   Gastric Emptying Study   With Contrast   Non-Authorized Criteria Not Met Review Exam Withdraw Exam  Clinical Rationale:   Your doctor told us that you have been having a sick feeling in the stomach. Your doctor ordered a special test to check your stomach. This test shows how quickly food leaves the stomach. This test is needed when your doctor has a reason to suspect that food leaves your stomach too slow or too fast. These reasons include findings from a physical exam, your medical history and other test results. We reviewed the notes we have. The notes do not show that you have these clinical findings. Based on the information we have, this test is not medically necessary. We used [employer] Medical Benefits Management Clinical Guideline titled Nuclear Medicine Imaging to make this decision.   Can you call them at [phone] ext [phone] to get it authorized? I faxed over clinicals so not sure what else they need.   Thank you.

## 2022-07-21 NOTE — Telephone Encounter (Signed)
This is very surprising and upsetting. Please contact the phone number for appeals in order to make the case that this patient does not fact need this study.  He has persistent vomiting leading to weight loss with a normal upper endoscopy. He also has a history of eating disorder which is known to be a risk factor for developing gastroparesis.  HD

## 2022-07-22 NOTE — Telephone Encounter (Signed)
Called the provided number. I spoke with [place] with [employer]. They have requested that we fax a letter of medical necessity to tthe appeals team at [phone]. I have printed and faxed letter as requested. Thanks

## 2022-07-24 NOTE — Telephone Encounter (Signed)
GES Authorized - Approval Valid Through: 07/15/2022 - 08/13/2022

## 2022-07-27 ENCOUNTER — Encounter (HOSPITAL_COMMUNITY): Payer: Self-pay

## 2022-07-27 ENCOUNTER — Ambulatory Visit (HOSPITAL_COMMUNITY)
Admission: RE | Admit: 2022-07-27 | Discharge: 2022-07-27 | Disposition: A | Discharge status: Home / Self Care | Payer: BC Managed Care – PPO | Source: Ambulatory Visit | Attending: Gastroenterology | Admitting: Gastroenterology

## 2022-07-27 DIAGNOSIS — R634 Abnormal weight loss: Secondary | ICD-10-CM | POA: Diagnosis present

## 2022-07-27 DIAGNOSIS — R112 Nausea with vomiting, unspecified: Secondary | ICD-10-CM | POA: Diagnosis present

## 2022-07-27 MED ORDER — IOHEXOL 300 MG/ML  SOLN
75.0000 mL | Freq: Once | INTRAMUSCULAR | Status: AC | PRN
  Administered 2022-07-27: 75 mL via INTRAVENOUS

## 2022-07-27 MED ORDER — SODIUM CHLORIDE (PF) 0.9 % IJ SOLN
INTRAMUSCULAR | Status: AC
  Filled 2022-07-27: qty 50

## 2022-07-28 ENCOUNTER — Encounter (HOSPITAL_COMMUNITY)
Admission: RE | Admit: 2022-07-28 | Discharge: 2022-07-28 | Disposition: A | Discharge status: Home / Self Care | Payer: BC Managed Care – PPO | Source: Ambulatory Visit | Attending: Gastroenterology | Admitting: Gastroenterology

## 2022-07-28 DIAGNOSIS — R112 Nausea with vomiting, unspecified: Secondary | ICD-10-CM | POA: Diagnosis present

## 2022-07-28 DIAGNOSIS — R634 Abnormal weight loss: Secondary | ICD-10-CM | POA: Insufficient documentation

## 2022-07-28 MED ORDER — TECHNETIUM TC 99M SULFUR COLLOID
2.0000 | Freq: Once | INTRAVENOUS | Status: AC
  Administered 2022-07-28: 2 via INTRAVENOUS

## 2022-08-04 ENCOUNTER — Encounter: Payer: Self-pay | Admitting: Gastroenterology

## 2022-08-04 MED ORDER — METOCLOPRAMIDE HCL 5 MG PO TABS: 5.0000 mg | ORAL_TABLET | Freq: Three times a day (TID) | ORAL | 1 refills | Status: DC | PRN

## 2022-12-22 ENCOUNTER — Inpatient Hospital Stay: Payer: BC Managed Care – PPO

## 2022-12-22 ENCOUNTER — Encounter: Payer: Self-pay | Admitting: Genetic Counselor

## 2022-12-22 ENCOUNTER — Inpatient Hospital Stay: Payer: BC Managed Care – PPO | Attending: Genetic Counselor | Admitting: Genetic Counselor

## 2022-12-22 ENCOUNTER — Other Ambulatory Visit: Payer: Self-pay | Admitting: Genetic Counselor

## 2022-12-22 DIAGNOSIS — Z803 Family history of malignant neoplasm of breast: Secondary | ICD-10-CM

## 2022-12-22 DIAGNOSIS — Z800 Family history of malignant neoplasm of digestive organs: Secondary | ICD-10-CM

## 2022-12-22 LAB — GENETIC SCREENING ORDER

## 2022-12-22 NOTE — Progress Notes (Signed)
gen

## 2022-12-22 NOTE — Progress Notes (Addendum)
REFERRING PROVIDER: Raynelle Dick, NP [address] [place],  [place] [address]  PRIMARY PROVIDER:  Raynelle Dick, NP  PRIMARY REASON FOR VISIT:  1. Family history of breast cancer      HISTORY OF PRESENT ILLNESS:   Mr. Freemon, a 22 y.o. 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  Mr. Kinzer presents to clinic today to discuss the possibility of a hereditary predisposition to cancer, genetic testing, and to further clarify his future cancer risks, as well as potential cancer risks for family members.   Mr. Larmon is a 22 y.o. male with no personal history of cancer.    CANCER HISTORY:  Oncology History   No history exists.    Past Medical History:  Diagnosis Date   Asthma    Chronic kidney disease    Depression    Family history of breast cancer    Headache(784.0)    IBS (irritable bowel syndrome)    IgA nephropathy with nephrotic syndrome     Past Surgical History:  Procedure Laterality Date   ADENOIDECTOMY Bilateral 02/23/2002   CIRCUMCISION  2003   COLONOSCOPY WITH ESOPHAGOGASTRODUODENOSCOPY (EGD)     In [place] at least 5 years ago   TYMPANOSTOMY Bilateral 02/23/2002    Social History   Socioeconomic History   Marital status: Single    Spouse name: Not on file   Number of children: Not on file   Years of education: Not on file   Highest education level: 11th grade  Occupational History   Occupation: Control and instrumentation engineer: [employer]  Tobacco Use   Smoking status: Every Day    Types: E-cigarettes    Start date: 01/14/2017   Smokeless tobacco: Current   Tobacco comments:    Vape everyday   Vaping Use   Vaping status: Every Day  Substance and Sexual Activity   Alcohol use: No   Drug use: Yes    Types: Marijuana    Comment: last used 06/28/2022   Sexual activity: Not on file  Other Topics Concern   Not on file  Social History  Narrative   While Brevon attended 8th grade at [employer] doing well living with parents and brother, Thor in 12th grade at [employer] lacks English assignments to graduate which mother considers he could make up but he refuses until next year or never finishing high school.  He has a job the last couple of weeks appreciating the pay but not enjoying the work.  He has recent cannabis charges by the police on his car which father expects will establish a criminal record while patient doubts that he will be prosecuted.  Jeremiyah is inconsistent in his history varying accounts of substance use, self-harm, depression and anxiety.  Mother suggest GI work-up in the past for IBS may have been more for symptoms and consequences of bulimia.  Cannabis is 3 or 4 times daily approximately $200 weekly and with cigarettes and vaping tobacco of several years that calm him as does cutting his thighs which may have occurred a couple of years ago or couple months ago.  He drives effectively disapproving that maternal grandmother took his bedroom at mother's home when her father died.  Patient has run away and has moved to live with a cousin but plans to live with boyfriend at least by 39  years of age in 2 months.   Social Determinants of Health   Financial Resource Strain: Not on file  Food Insecurity: Not on file  Transportation Needs: Not on file  Physical Activity: Not on file  Stress: Not on file  Social Connections: Unknown (07/07/2021)   Received from [facility]   Social Network    Social Network: Not on file     FAMILY HISTORY:  We obtained a detailed, 4-generation family history.  Significant diagnoses are listed below: Family History  Problem Relation Age of Onset   Migraines Mother    Allergic rhinitis Mother    Allergic rhinitis Father    Hypertension Father    Nephrotic syndrome Father    Depression Father    Allergic rhinitis Brother     Asthma Brother    Breast cancer Maternal Aunt        dx. 71s-50s, mother's pat half sister   Hypertension Maternal Grandmother    Heart attack Maternal Grandmother    Pancreatic cancer Maternal Grandfather 68   Hypertension Maternal Grandfather    Heart attack Maternal Grandfather    Depression Paternal Grandfather    Breast cancer Maternal Great-grandmother        MGF's mother; dx in her 62s   Breast cancer Other        dx. 65s, MGMs sister   Pancreatic cancer Other        d. 54s, MGMs brother   Breast cancer Cousin        Mother's maternal first cousin; dx in her 53s   Colon cancer Neg Hx    Esophageal cancer Neg Hx      The patient does not have children.  He has one brother who is cancer free.  Both parents are living.  The patient's father is 69 and cancer free.  He has one sister who is estranged from the family.  The paternal grandparents do not have cancer.  The patient's mother is cancer free.  She was found to carry a CHEK2 pathogenic variant.  She has a full sister, and three paternal half brothers and one sister.  The half sister had breast cancer in her 75's-50's.  Her father died of pancreatic cancer.  His mother had breast cancer in her 81's.  The grandmother is living and she had a brother who died of pancreatic cancer and his daughter had breast cancer in her 63's, and she had a sister who had breast cancer in her 25's.  Mr. Whelan is aware of previous family history of genetic testing for hereditary cancer risks.  There is no reported Ashkenazi Jewish ancestry. There is no known consanguinity.  GENETIC COUNSELING ASSESSMENT: Mr. Cocchiola is a 22 y.o. 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 We, therefore, discussed and recommended the following at today's visit.   DISCUSSION: We discussed that, in general, most cancer is not inherited in families, but instead is sporadic or  familial. Sporadic cancers occur by chance and typically happen at older ages (>50 years) as this type of cancer is caused by genetic changes acquired during an individual's lifetime. Some families have more cancers than would be expected by chance; however, the ages or types of cancer are not consistent with a known genetic mutation or known genetic mutations have been ruled out. This type of familial cancer is thought to be due to a combination of multiple  genetic, environmental, hormonal, and lifestyle factors. While this combination of factors likely increases the risk of cancer, the exact source of this risk is not currently identifiable or testable.  We discussed that 5 - 10% of breast cancer is hereditary.  His mother underwent genetic testing due to her family history of breast and pancreatic cancer and was found to have a founder mutation in CHEK2.  This can explain the breast cancer but not pancreatic cancer.  Mr. Steketee has a 50% chance of also carrying this mutation.  We discussed that his risk for cancer is low if he had the mutation due to being male, but that he could pass this on to his children.  Cancer risks for CHEK2 include up to a 40% risk for breast cancer and an increased risk for prostate cancer.  We no longer feel there is an increased risk for colon cancer in CHEK2 families.   We reviewed the characteristics, features and inheritance patterns of hereditary cancer syndromes. We also discussed genetic testing, including the appropriate family members to test, the process of testing, insurance coverage and turn-around-time for results. We discussed the implications of a negative, positive, carrier and/or variant of uncertain significant result. Mr. Dostal  was offered family variant testing (FVT) for the known CHEK2 mutation in the family based on the lack of cancer history on his paternal side and the only finding of a CHEK2 pathogenic variant in his mother. Mr. Story decided to pursue  genetic testing for the CHEK2 c.1100delC variant.   Based on Mr. Malinak family history of cancer, he meets the laboratory's FVT criteria for complementary testing of the one pathogenic variant.  We do not expect that Mr. Paul Dykes will be charged for this testing..   We discussed that some people do not want to undergo genetic testing due to fear of genetic discrimination.  The Genetic Information Nondiscrimination Act (GINA) was signed into federal law in 2008. GINA prohibits health insurers and most employers from discriminating against individuals based on genetic information (including the results of genetic tests and family history information). According to GINA, health insurance companies cannot consider genetic information to be a preexisting condition, nor can they use it to make decisions regarding coverage or rates. GINA also makes it illegal for most employers to use genetic information in making decisions about hiring, firing, promotion, or terms of employment. It is important to note that GINA does not offer protections for life insurance, disability insurance, or long-term care insurance. GINA does not apply to those in the [employer], those who work for companies with less than 15 employees, and new life insurance or long-term disability insurance policies.  Health status due to a cancer diagnosis is not protected under GINA. More information about GINA can be found by visiting [email or web address].     PLAN: After considering the risks, benefits, and limitations, Mr. Turso provided informed consent to pursue genetic testing and the blood sample was sent to [facility] for analysis of the CHEK2 family variant. Results should be available within approximately 2-3 weeks' time, at which point they will be disclosed by telephone to Mr. Angermeier, as will any additional recommendations warranted by these results. Mr. Polhemus will receive a summary of his genetic counseling visit and a copy of  his results once available. This information will also be available in Epic.   Lastly, we encouraged Mr. Olds to remain in contact with cancer genetics annually so that we can continuously update the family history and inform  him of any changes in cancer genetics and testing that may be of benefit for this family.   Mr. Ramani questions were answered to his satisfaction today. Our contact information was provided should additional questions or concerns arise. Thank you for the referral and allowing [place] to share in the care of your patient.   Cormac Wint P. Lowell Guitar, MS, [facility] Licensed, Patent attorney [email or web address] phone: [phone]  The patient was seen for a total of 30 minutes in face-to-face genetic counseling.  The patient was seen alone.  Drs. Meliton Rattan, and/or [place] were available for questions, if needed..    _______________________________________________________________________ For Office Staff:  Number of people involved in session: 1 Was an Intern/ student involved with case: no

## 2023-01-06 ENCOUNTER — Ambulatory Visit: Payer: Self-pay | Admitting: Genetic Counselor

## 2023-01-06 ENCOUNTER — Telehealth: Payer: Self-pay | Admitting: Genetic Counselor

## 2023-01-06 ENCOUNTER — Encounter: Payer: Self-pay | Admitting: Genetic Counselor

## 2023-01-06 DIAGNOSIS — Z1379 Encounter for other screening for genetic and chromosomal anomalies: Secondary | ICD-10-CM

## 2023-01-06 NOTE — Telephone Encounter (Signed)
Revealed negative genetic testing for the single site CHEK2 variant identified in his mother.  Patient is not at increased risk for any of the CHEK2 cancers, and is not at risk for having children with CHEK2 based on his family history.

## 2023-01-06 NOTE — Progress Notes (Signed)
HPI:  Mr. Marandola was previously seen in the [facility] due to a family history of breast cancer and a known CHEK2 pathogenic variant and concerns regarding a hereditary predisposition to cancer. Please refer to our prior cancer genetics clinic note for more information regarding our discussion, assessment and recommendations, at the time. Mr. Luebke recent genetic test results were disclosed to him, as were recommendations warranted by these results. These results and recommendations are discussed in more detail below.  CANCER HISTORY:  Oncology History   No history exists.    FAMILY HISTORY:  We obtained a detailed, 4-generation family history.  Significant diagnoses are listed below: Family History  Problem Relation Age of Onset   Migraines Mother    Allergic rhinitis Mother    Allergic rhinitis Father    Hypertension Father    Nephrotic syndrome Father    Depression Father    Allergic rhinitis Brother    Asthma Brother    Breast cancer Maternal Aunt        dx. 84s-50s, mother's pat half sister   Hypertension Maternal Grandmother    Heart attack Maternal Grandmother    Pancreatic cancer Maternal Grandfather 68   Hypertension Maternal Grandfather    Heart attack Maternal Grandfather    Depression Paternal Grandfather    Breast cancer Maternal Great-grandmother        MGF's mother; dx in her 64s   Breast cancer Other        dx. 26s, MGMs sister   Pancreatic cancer Other        d. 14s, MGMs brother   Breast cancer Cousin        Mother's maternal first cousin; dx in her 81s   Colon cancer Neg Hx    Esophageal cancer Neg Hx        The patient does not have children.  He has one brother who is cancer free.  Both parents are living.   The patient's father is 5 and cancer free.  He has one sister who is estranged from the family.  The paternal grandparents do not have cancer.   The patient's mother is cancer free.  She was found to carry a CHEK2  pathogenic variant.  She has a full sister, and three paternal half brothers and one sister.  The half sister had breast cancer in her 30's-50's.  Her father died of pancreatic cancer.  His mother had breast cancer in her 45's.  The grandmother is living and she had a brother who died of pancreatic cancer and his daughter had breast cancer in her 41's, and she had a sister who had breast cancer in her 19's.   Mr. Wolbert is aware of previous family history of genetic testing for hereditary cancer risks.  There is no reported Ashkenazi Jewish ancestry. There is no known consanguinity.  GENETIC TEST RESULTS: We recommended Mr. Golson pursue testing for the familial hereditary cancer gene mutation called CHEK2, c.1100delC. Mr. Weisheit test was normal and did not reveal the familial mutation. We call this result a true negative result because the cancer-causing mutation was identified in Mr. Trude family, and he did not inherit it.  Given this negative result, Mr. Ivanova chances of developing CHEK2-related cancers are the same as they are in the general population.  The test report has been scanned into EPIC and is located under the Molecular Pathology section of the Results Review tab.  A portion of the result report is included below for  reference.     CANCER SCREENING RECOMMENDATIONS: Mr. Zafar test result is considered negative (normal).  It is recommended he continue to follow the cancer management and screening guidelines provided by his primary healthcare provider. An individual's cancer risk and medical management are not determined by genetic test results alone. Overall cancer risk assessment incorporates additional factors, including personal medical history, family history, and any available genetic information that may result in a personalized plan for cancer prevention and surveillance  RECOMMENDATIONS FOR FAMILY MEMBERS:  Individuals in this family might be at some increased risk of  developing cancer, over the general population risk, simply due to the family history of cancer and the known CHEK2 pathogenic variant.  We recommend that individuals on the maternal side of the family consider genetic testing.  Additionally, we recommended women in this family have a yearly mammogram beginning at age 35, or 61 years younger than the earliest onset of cancer, an annual clinical breast exam, and perform monthly breast self-exams. Women in this family should also have a gynecological exam as recommended by their primary provider. All family members should be referred for colonoscopy starting at age 56.  FOLLOW-UP: Lastly, we discussed with Mr. Lafler that cancer genetics is a rapidly advancing field and it is possible that new genetic tests will be appropriate for him and/or his family members in the future. We encouraged him to remain in contact with cancer genetics on an annual basis so we can update his personal and family histories and let him know of advances in cancer genetics that may benefit this family.   Our contact number was provided. Mr. Fuentez questions were answered to his satisfaction, and he knows he is welcome to call us at anytime with additional questions or concerns.   Maylon Cos, MS, [facility] Licensed, Certified Genetic Counselor [email or web address]

## 2023-02-10 DIAGNOSIS — E782 Mixed hyperlipidemia: Secondary | ICD-10-CM | POA: Insufficient documentation

## 2023-02-10 DIAGNOSIS — F3341 Major depressive disorder, recurrent, in partial remission: Secondary | ICD-10-CM | POA: Insufficient documentation

## 2023-02-10 DIAGNOSIS — E559 Vitamin D deficiency, unspecified: Secondary | ICD-10-CM | POA: Insufficient documentation

## 2023-02-10 NOTE — Progress Notes (Unsigned)
 Complete Physical  Assessment and Plan: Health Maintenance- Discussed STD testing, safe sex, alcohol and drug awareness, drinking and driving dangers, wearing a seat belt and general safety measures for young adult. Clinton Jones was seen today for annual exam.  Diagnoses and all orders for this visit:  Encounter for general adult medical examination with abnormal findings Due Yearly  IgA nephropathy -     CBC with Differential/Platelet -     COMPLETE METABOLIC PANEL WITH GFR Continue to follow with nephrology  Bulimia nervosa Continue to follow with psychiatry Discussed diet and need for MVI and protein States he is currently eating breakfast and dinner, has not been binging and purging  Major depression, chronic Continue vraylar and continue to follow with pscych Symptoms are currently well controlled  Migraine without aura and without status migrainosus, not intractable Currently well controlled  Nicotine dependence due to vaping tobacco product Counseled on the dangers of vaping and stressed the importance of quitting.  He is not currently ready to quit.   Screening for hematuria or proteinuria -     Urinalysis, Routine w reflex microscopic -     Microalbumin / creatinine urine ratio  Screening for thyroid disorder -     TSH  Hyperlipidemia, mixed Continue diet and exercise -     Lipid panel  Screening for diabetes mellitus -     Hemoglobin A1c  Vitamin D deficiency -     VITAMIN D 25 Hydroxy (Vit-D Deficiency, Fractures)  Medication management -     Magnesium  Vaping Continues to vape nicotine and marijuana Counseled to stop vaping and dangers associated discussed     Discussed med's effects and SE's. Screening labs and tests as requested with regular follow-up as recommended. Over 40 minutes of exam, counseling, chart review and critical decision making was performed   HPI  This very nice 22 y.o.male presents for complete physical.  Patient has no major  health issues.  Patient reports no complaints at this time.   BMI is Body mass index is 19.72 kg/m., he has been working on diet and exercise. Appetite has been good.  Wt Readings from Last 3 Encounters:  02/11/23 127 lb 12.8 oz (58 kg)  06/29/22 130 lb (59 kg)  06/15/22 130 lb (59 kg)   Currently on Vraylar and mood is well controlled.   Bp well controlled without medication BP Readings from Last 3 Encounters:  02/11/23 100/60  06/29/22 118/80  06/15/22 96/70  Denies chest pain, shortness of breath, dizziness and headaches  Lab Results  Component Value Date   CHOL 131 02/10/2021   HDL 52 02/10/2021   LDLCALC 64 02/10/2021   TRIG 73 02/10/2021   CHOLHDL 2.5 02/10/2021     He does not workout.  Finally, patient has history of Vitamin D Deficiency and last vitamin D was  Lab Results  Component Value Date   VD25OH 25 (L) 02/10/2021  .  Currently on supplementation  Current Medications:  Current Outpatient Medications on File Prior to Visit  Medication Sig Dispense Refill   metoCLOPramide (REGLAN) 5 MG tablet Take 1 tablet (5 mg total) by mouth every 8 (eight) hours as needed for nausea. (Patient not taking: Reported on 02/11/2023) 45 tablet 1   ondansetron (ZOFRAN) 4 MG tablet Take 1 tablet (4 mg total) by mouth every 8 (eight) hours as needed for nausea or vomiting. 30 tablet 1   VRAYLAR 3 MG capsule Take 3 mg by mouth at bedtime.     esomeprazole (  NEXIUM) 40 MG capsule Take 1 capsule (40 mg total) by mouth 2 (two) times daily before a meal for 28 days. 28 capsule 1   ondansetron (ZOFRAN-ODT) 4 MG disintegrating tablet Take 1 tablet (4 mg total) by mouth every 8 (eight) hours as needed for nausea or vomiting. (Patient not taking: Reported on 02/11/2023) 20 tablet 0   pantoprazole (PROTONIX) 40 MG tablet Take 1 tablet (40 mg total) by mouth daily. (Patient not taking: Reported on 02/11/2023) 30 tablet 0   prochlorperazine (COMPAZINE) 10 MG tablet Take 1 tablet (10 mg total) by  mouth at bedtime. (Patient not taking: Reported on 02/11/2023) 30 tablet 1   prochlorperazine (COMPAZINE) 25 MG suppository Place 1 suppository (25 mg total) rectally every 12 (twelve) hours as needed for nausea or vomiting. For breakthrough nausea and vomiting if unable to tolerate medicines by mouth (Patient not taking: Reported on 02/11/2023) 12 suppository 0   sucralfate (CARAFATE) 1 g tablet Take 1 tablet (1 g total) by mouth 4 (four) times daily. (Patient not taking: Reported on 03/30/2022) 120 tablet 1   No current facility-administered medications on file prior to visit.   Health Maintenance:   Immunization History  Administered Date(s) Administered   DTaP 11/17/2001, 01/10/2002, 03/14/2002, 12/25/2002, 09/23/2005   HIB (PRP-OMP) 11/17/2001, 01/10/2002, 03/14/2002, 09/13/2002   Hepatitis A 11/06/2004, 09/23/2005   Hepatitis B 08-20-2001, 10/17/2001, 06/19/2002   IPV 11/17/2001, 01/10/2002, 12/25/2002, 09/23/2005   Influenza,inj,Quad PF,6+ Mos 02/10/2021   Influenza-Unspecified 11/30/2011, 01/03/2014, 12/09/2018   MMR 09/13/2002, 09/23/2005   MenQuadfi_Meningococcal Groups ACYW Conjugate 07/18/2013, 08/05/2018   Meningococcal B, OMV 08/05/2018, 10/30/2020   Pneumococcal-Unspecified 11/17/2001, 01/10/2002, 03/14/2002, 11/22/2002   Tdap 05/20/2012   Varicella 12/25/2002, 09/23/2005     Allergies:  Allergies  Allergen Reactions   Morphine And Codeine Hives, Nausea And Vomiting and Rash   Medical History:  has Migraine with aura; Tension headache; Migraine without aura and without status migrainosus, not intractable; Mild recurrent major depression (HCC); Cannabis use disorder, moderate, dependence (HCC); Nicotine dependence due to vaping tobacco product; Abdominal pain, chronic, generalized; Abnormal weight loss; IgA nephropathy; Irritable bowel syndrome with constipation and diarrhea; Recurrent oral ulcers; Bulimia nervosa; Family history of breast cancer; Genetic testing; Vitamin D  deficiency; Recurrent major depressive disorder, in partial remission (HCC); and Mixed hyperlipidemia on their problem list. Surgical History:  He  has a past surgical history that includes Tympanostomy (Bilateral, 02/23/2002); Adenoidectomy (Bilateral, 02/23/2002); Circumcision (2003); and Colonoscopy with esophagogastroduodenoscopy (egd). Family History:  Hisfamily history includes Allergic rhinitis in his brother, father, and mother; Asthma in his brother; Breast cancer in his cousin, maternal aunt, maternal great-grandmother, and another family member; Depression in his father and paternal grandfather; Heart attack in his maternal grandfather and maternal grandmother; Hypertension in his father, maternal grandfather, and maternal grandmother; Migraines in his mother; Nephrotic syndrome in his father; Pancreatic cancer in an other family member; Pancreatic cancer (age of onset: 9) in his maternal grandfather. Social History:   reports that he has been smoking e-cigarettes. He started smoking about 6 years ago. He uses smokeless tobacco. He reports current drug use. Drug: Marijuana. He reports that he does not drink alcohol.  Review of Systems: Review of Systems  Constitutional:  Negative for chills and fever.  HENT:  Negative for congestion, hearing loss, sinus pain, sore throat and tinnitus.   Eyes:  Negative for blurred vision and double vision.  Respiratory:  Negative for cough, hemoptysis, sputum production, shortness of breath and wheezing.   Cardiovascular:  Negative for chest pain, palpitations and leg swelling.  Gastrointestinal:  Negative for abdominal pain, constipation, diarrhea, heartburn, nausea and vomiting.  Genitourinary:  Negative for dysuria, frequency, hematuria and urgency.  Musculoskeletal:  Negative for back pain, falls, joint pain, myalgias and neck pain.  Skin:  Negative for rash.  Neurological:  Negative for dizziness, tingling, tremors, weakness and headaches.   Endo/Heme/Allergies:  Does not bruise/bleed easily.  Psychiatric/Behavioral:  Negative for suicidal ideas. Depression: controlled with medication.The patient is not nervous/anxious and does not have insomnia.     Physical Exam: Estimated body mass index is 19.72 kg/m as calculated from the following:   Height as of this encounter: 5' 7.5" (1.715 m).   Weight as of this encounter: 127 lb 12.8 oz (58 kg). BP 100/60   Pulse 67   Temp 97.9 F (36.6 C)   Ht 5' 7.5" (1.715 m)   Wt 127 lb 12.8 oz (58 kg)   SpO2 99%   BMI 19.72 kg/m  General Appearance: very pleasant thin male, in no apparent distress.  Eyes: PERRLA, EOMs, conjunctiva no swelling or erythema Sinuses: No Frontal/maxillary tenderness  ENT/Mouth: Ext aud canals clear, normal light reflex with TMs without erythema, bulging. Good dentition. No erythema, swelling, or exudate on post pharynx. Tonsils not swollen or erythematous. Hearing normal.  Neck: Supple, thyroid normal. No bruits  Respiratory: Respiratory effort normal, BS equal bilaterally without rales, rhonchi, wheezing or stridor.  Cardio: RRR without murmurs, rubs or gallops. Brisk peripheral pulses without edema.  Chest: symmetric, with normal excursions and percussion.  Abdomen: Soft, nontender, no guarding, rebound, hernias, masses, or organomegaly.  Lymphatics: Non tender without lymphadenopathy.  Genitourinary: defer Musculoskeletal: Full ROM all peripheral extremities,5/5 strength, and normal gait.  Skin: Warm, dry without rashes, lesions, ecchymosis. Neuro: Cranial nerves intact, reflexes equal bilaterally. Normal muscle tone, no cerebellar symptoms. Sensation intact.  Psych: Awake and oriented X 3, normal affect, Insight and Judgment appropriate.   EKG: defer  Laurabelle Gorczyca E  3:17 PM [facility]

## 2023-02-11 ENCOUNTER — Ambulatory Visit (INDEPENDENT_AMBULATORY_CARE_PROVIDER_SITE_OTHER): Payer: BC Managed Care – PPO | Admitting: Nurse Practitioner

## 2023-02-11 ENCOUNTER — Encounter: Payer: Self-pay | Admitting: Nurse Practitioner

## 2023-02-11 VITALS — BP 100/60 | HR 67 | Temp 97.9°F | Ht 67.5 in | Wt 127.8 lb

## 2023-02-11 DIAGNOSIS — N02B9 Other recurrent and persistent immunoglobulin A nephropathy: Secondary | ICD-10-CM

## 2023-02-11 DIAGNOSIS — F17290 Nicotine dependence, other tobacco product, uncomplicated: Secondary | ICD-10-CM

## 2023-02-11 DIAGNOSIS — Z0000 Encounter for general adult medical examination without abnormal findings: Secondary | ICD-10-CM | POA: Diagnosis not present

## 2023-02-11 DIAGNOSIS — Z1389 Encounter for screening for other disorder: Secondary | ICD-10-CM

## 2023-02-11 DIAGNOSIS — Z79899 Other long term (current) drug therapy: Secondary | ICD-10-CM | POA: Diagnosis not present

## 2023-02-11 DIAGNOSIS — Z131 Encounter for screening for diabetes mellitus: Secondary | ICD-10-CM

## 2023-02-11 DIAGNOSIS — Z0001 Encounter for general adult medical examination with abnormal findings: Secondary | ICD-10-CM

## 2023-02-11 DIAGNOSIS — E559 Vitamin D deficiency, unspecified: Secondary | ICD-10-CM

## 2023-02-11 DIAGNOSIS — G43009 Migraine without aura, not intractable, without status migrainosus: Secondary | ICD-10-CM

## 2023-02-11 DIAGNOSIS — Z13220 Encounter for screening for lipoid disorders: Secondary | ICD-10-CM | POA: Diagnosis not present

## 2023-02-11 DIAGNOSIS — E782 Mixed hyperlipidemia: Secondary | ICD-10-CM

## 2023-02-11 DIAGNOSIS — Z1329 Encounter for screening for other suspected endocrine disorder: Secondary | ICD-10-CM

## 2023-02-11 DIAGNOSIS — F3341 Major depressive disorder, recurrent, in partial remission: Secondary | ICD-10-CM

## 2023-02-11 DIAGNOSIS — F502 Bulimia nervosa, unspecified: Secondary | ICD-10-CM

## 2023-02-11 NOTE — Patient Instructions (Signed)

## 2023-02-12 LAB — COMPLETE METABOLIC PANEL WITH GFR
AG Ratio: 1.5 (calc) (ref 1.0–2.5)
ALT: 10 U/L (ref 9–46)
AST: 13 U/L (ref 10–40)
Albumin: 4.3 g/dL (ref 3.6–5.1)
Alkaline phosphatase (APISO): 136 U/L — ABNORMAL HIGH (ref 36–130)
BUN: 13 mg/dL (ref 7–25)
CO2: 30 mmol/L (ref 20–32)
Calcium: 9.5 mg/dL (ref 8.6–10.3)
Chloride: 101 mmol/L (ref 98–110)
Creat: 0.92 mg/dL (ref 0.60–1.24)
Globulin: 2.8 g/dL (ref 1.9–3.7)
Glucose, Bld: 88 mg/dL (ref 65–99)
Potassium: 4.1 mmol/L (ref 3.5–5.3)
Sodium: 140 mmol/L (ref 135–146)
Total Bilirubin: 0.9 mg/dL (ref 0.2–1.2)
Total Protein: 7.1 g/dL (ref 6.1–8.1)
eGFR: 121 mL/min/{1.73_m2} (ref >=60)

## 2023-02-12 LAB — CBC WITH DIFFERENTIAL/PLATELET
Absolute Lymphocytes: 1888 {cells}/uL (ref 850–3900)
Absolute Monocytes: 389 {cells}/uL (ref 200–950)
Basophils Absolute: 41 {cells}/uL (ref 0–200)
Basophils Relative: 0.7 %
Eosinophils Absolute: 47 {cells}/uL (ref 15–500)
Eosinophils Relative: 0.8 %
HCT: 46.4 % (ref 38.5–50.0)
Hemoglobin: 16.1 g/dL (ref 13.2–17.1)
MCH: 32.7 pg (ref 27.0–33.0)
MCHC: 34.7 g/dL (ref 32.0–36.0)
MCV: 94.3 fL (ref 80.0–100.0)
MPV: 11.1 fL (ref 7.5–12.5)
Monocytes Relative: 6.6 %
Neutro Abs: 3534 {cells}/uL (ref 1500–7800)
Neutrophils Relative %: 59.9 %
Platelets: 302 10*3/uL (ref 140–400)
RBC: 4.92 10*6/uL (ref 4.20–5.80)
RDW: 12.5 % (ref 11.0–15.0)
Total Lymphocyte: 32 %
WBC: 5.9 10*3/uL (ref 3.8–10.8)

## 2023-02-12 LAB — URINALYSIS, ROUTINE W REFLEX MICROSCOPIC
Bacteria, UA: NONE SEEN /[HPF]
Bilirubin Urine: NEGATIVE
Glucose, UA: NEGATIVE
Hyaline Cast: NONE SEEN /[LPF]
Ketones, ur: NEGATIVE
Leukocytes,Ua: NEGATIVE
Nitrite: NEGATIVE
RBC / HPF: NONE SEEN /[HPF] (ref 0–2)
Specific Gravity, Urine: 1.025 (ref 1.001–1.035)
Squamous Epithelial / HPF: NONE SEEN /[HPF] (ref <=5)
WBC, UA: NONE SEEN /[HPF] (ref 0–5)
pH: 6.5 (ref 5.0–8.0)

## 2023-02-12 LAB — MICROSCOPIC MESSAGE

## 2023-02-12 LAB — MICROALBUMIN / CREATININE URINE RATIO
Creatinine, Urine: 198 mg/dL (ref 20–320)
Microalb Creat Ratio: 51 mg/g{creat} — ABNORMAL HIGH (ref <30)
Microalb, Ur: 10 mg/dL

## 2023-02-12 LAB — LIPID PANEL
Cholesterol: 159 mg/dL (ref <200)
HDL: 70 mg/dL (ref >=40)
LDL Cholesterol (Calc): 66 mg/dL
Non-HDL Cholesterol (Calc): 89 mg/dL (ref <130)
Total CHOL/HDL Ratio: 2.3 (calc) (ref <5.0)
Triglycerides: 147 mg/dL (ref <150)

## 2023-02-12 LAB — VITAMIN D 25 HYDROXY (VIT D DEFICIENCY, FRACTURES): Vit D, 25-Hydroxy: 27 ng/mL — ABNORMAL LOW (ref 30–100)

## 2023-02-12 LAB — MAGNESIUM: Magnesium: 2.2 mg/dL (ref 1.5–2.5)

## 2023-02-12 LAB — TSH: TSH: 2.95 m[IU]/L (ref 0.40–4.50)

## 2023-02-12 LAB — HEMOGLOBIN A1C W/OUT EAG: Hgb A1c MFr Bld: 5.1 %{Hb} (ref <5.7)

## 2023-06-27 ENCOUNTER — Encounter (HOSPITAL_COMMUNITY): Payer: Self-pay

## 2023-06-27 ENCOUNTER — Ambulatory Visit (HOSPITAL_COMMUNITY)
Admission: EM | Admit: 2023-06-27 | Discharge: 2023-06-27 | Disposition: A | Discharge status: Home / Self Care | Attending: Emergency Medicine | Admitting: Emergency Medicine

## 2023-06-27 DIAGNOSIS — M5442 Lumbago with sciatica, left side: Secondary | ICD-10-CM | POA: Diagnosis not present

## 2023-06-27 DIAGNOSIS — M5441 Lumbago with sciatica, right side: Secondary | ICD-10-CM

## 2023-06-27 MED ORDER — METHYLPREDNISOLONE SODIUM SUCC 125 MG IJ SOLR
60.0000 mg | Freq: Once | INTRAMUSCULAR | Status: AC
  Administered 2023-06-27: 60 mg via INTRAMUSCULAR

## 2023-06-27 MED ORDER — PREDNISONE 20 MG PO TABS: 40.0000 mg | ORAL_TABLET | Freq: Every day | ORAL | 0 refills | Status: AC

## 2023-06-27 MED ORDER — METHYLPREDNISOLONE SODIUM SUCC 125 MG IJ SOLR
INTRAMUSCULAR | Status: AC
  Filled 2023-06-27: qty 2

## 2023-06-27 NOTE — ED Provider Notes (Signed)
 [facility]    CSN: [id number] Arrival date & time: 06/27/23  1046      History   Chief Complaint Chief Complaint  Patient presents with   Back Pain    HPI Clinton Jones is a 23 y.o. male.  1 week history of low back pain that radiates down both legs. Rating 6/10 discomfort currently. Symptoms started after he was moving and lifting boxes No other injury, trauma, fall. No history of this Denies weakness or paresthesias. No bladder/bowel dysfunction  Has tried ibuprofen and menthol rub  History of kidney disease. 4 months ago CMP was within normal limits   No hematuria, dysuria, flank pain, fever  Past Medical History:  Diagnosis Date   Asthma    Chronic kidney disease    Depression    Family history of breast cancer    Headache(784.0)    IBS (irritable bowel syndrome)    IgA nephropathy with nephrotic syndrome     Patient Active Problem List   Diagnosis Date Noted   Vitamin D  deficiency 02/10/2023   Recurrent major depressive disorder, in partial remission (HCC) 02/10/2023   Mixed hyperlipidemia 02/10/2023   Genetic testing 01/06/2023   Family history of breast cancer    Bulimia nervosa 07/10/2019   Mild recurrent major depression (HCC) 06/27/2019   Cannabis use disorder, moderate, dependence (HCC) 06/27/2019   Nicotine  dependence due to vaping tobacco product 06/27/2019   Abnormal weight loss 05/02/2019   Recurrent oral ulcers 10/27/2018   IgA nephropathy 08/23/2018   Abdominal pain, chronic, generalized 10/11/2017   Irritable bowel syndrome with constipation and diarrhea 08/03/2016   Migraine without aura and without status migrainosus, not intractable 03/02/2014   Tension headache 09/13/2012   Migraine with aura 09/06/2012    Past Surgical History:  Procedure Laterality Date   ADENOIDECTOMY Bilateral 02/23/2002   CIRCUMCISION  2003   COLONOSCOPY WITH ESOPHAGOGASTRODUODENOSCOPY (EGD)     In [place] at least 5 years ago   TYMPANOSTOMY  Bilateral 02/23/2002       Home Medications    Prior to Admission medications   Medication Sig Start Date End Date Taking? Authorizing Provider  predniSONE (DELTASONE) 20 MG tablet Take 2 tablets (40 mg total) by mouth daily with breakfast for 5 days. 06/28/23 07/03/23 Yes Joron Velis, PA-C  VRAYLAR 3 MG capsule Take 3 mg by mouth at bedtime. 02/03/23   [provider]    Family History Family History  Problem Relation Age of Onset   Migraines Mother    Allergic rhinitis Mother    Allergic rhinitis Father    Hypertension Father    Nephrotic syndrome Father    Depression Father    Allergic rhinitis Brother    Asthma Brother    Breast cancer Maternal Aunt        dx. 67s-50s, mother's pat half sister   Hypertension Maternal Grandmother    Heart attack Maternal Grandmother    Pancreatic cancer Maternal Grandfather 68   Hypertension Maternal Grandfather    Heart attack Maternal Grandfather    Depression Paternal Grandfather    Breast cancer Maternal Great-grandmother        MGF's mother; dx in her 17s   Breast cancer Other        dx. 55s, MGMs sister   Pancreatic cancer Other        d. 58s, MGMs brother   Breast cancer Cousin        Mother's maternal first cousin; dx in her 15s  Colon cancer Neg Hx    Esophageal cancer Neg Hx     Social History Social History   Tobacco Use   Smoking status: Every Day    Types: E-cigarettes    Start date: 01/14/2017   Smokeless tobacco: Current   Tobacco comments:    Vape everyday   Vaping Use   Vaping status: Every Day   Substances: Nicotine , Flavoring  Substance Use Topics   Alcohol use: Yes   Drug use: Yes    Types: Marijuana    Comment: last used 06/28/2022     Allergies   Morphine and codeine   Review of Systems Review of Systems  Musculoskeletal:  Positive for back pain.   Per HPI  Physical Exam Triage Vital Signs ED Triage Vitals [06/27/23 1112]  Encounter Vitals Group     BP 105/76      Systolic BP Percentile      Diastolic BP Percentile      Pulse Rate 68     Resp 14     Temp 98 F (36.7 C)     Temp Source Oral     SpO2 96 %     Weight      Height      Head Circumference      Peak Flow      Pain Score 6     Pain Loc      Pain Education      Exclude from Growth Chart    No data found.  Updated Vital Signs BP 105/76 (BP Location: Right Arm)   Pulse 68   Temp 98 F (36.7 C) (Oral)   Resp 14   SpO2 96%   Physical Exam Vitals and nursing note reviewed.  Constitutional:      General: He is not in acute distress.    Appearance: He is not ill-appearing or diaphoretic.  HENT:     Mouth/Throat:     Mouth: Mucous membranes are moist.     Pharynx: Oropharynx is clear.  Eyes:     Extraocular Movements: Extraocular movements intact.     Conjunctiva/sclera: Conjunctivae normal.     Pupils: Pupils are equal, round, and reactive to light.  Cardiovascular:     Rate and Rhythm: Normal rate and regular rhythm.     Pulses: Normal pulses.     Heart sounds: Normal heart sounds.  Pulmonary:     Effort: Pulmonary effort is normal.     Breath sounds: Normal breath sounds.  Abdominal:     Tenderness: There is no right CVA tenderness or left CVA tenderness.  Musculoskeletal:     Cervical back: Normal range of motion. No rigidity or tenderness.     Lumbar back: No tenderness or bony tenderness. Normal range of motion. Positive right straight leg raise test and positive left straight leg raise test.     Comments: Full ROM of neck and back without bony tenderness. +SLR bilaterally, worse on the right  Skin:    General: Skin is warm and dry.  Neurological:     General: No focal deficit present.     Mental Status: He is alert and oriented to person, place, and time.     Cranial Nerves: Cranial nerves 2-12 are intact. No cranial nerve deficit.     Sensory: Sensation is intact.     Motor: Motor function is intact. No weakness.     Coordination: Coordination is intact.      Gait: Gait is intact.  Deep Tendon Reflexes: Reflexes are normal and symmetric.     Comments: Strength 5/5. Sensation intact throughout      UC Treatments / Results  Labs (all labs ordered are listed, but only abnormal results are displayed) Labs Reviewed - No data to display  EKG   Radiology No results found.  Procedures Procedures (including critical care time)  Medications Ordered in UC Medications  methylPREDNISolone sodium succinate (SOLU-MEDROL) 125 mg/2 mL injection 60 mg (60 mg Intramuscular Given 06/27/23 1159)    Initial Impression / Assessment and Plan / UC Course  I have reviewed the triage vital signs and the nursing notes.  Pertinent labs & imaging results that were available during my care of the patient were reviewed by me and considered in my medical decision making (see chart for details).  Low back pain with bilateral sciatica No red flags. Neurologically intact. Stable vitals IM solumedrol given in clinic for acute pain relief Starting tomorrow, prednisone burst Advised no NSAIDs while taking steroids, and also advised against NSAIDs given his history of kidney disease. Other supportive care is discussed. He has follow up with PCP in 2 days. Reasons to return to clinic or be seen in ED discussed Patient agrees to plan, no questions Note for work is provided   Final Clinical Impressions(s) / UC Diagnoses   Final diagnoses:  Acute bilateral low back pain with bilateral sciatica     Discharge Instructions      The steroid injection should help to work in about 30 minutes to reduce pain and inflammation.  Starting tomorrow morning, take the prednisone as directed.  2 tablets with breakfast for 5 days in a row. You can safely use Tylenol.  I do not recommend any ibuprofen/Advil, naproxen/Aleve or other NSAIDs.  Please follow-up with your primary care provider regarding symptoms     ED Prescriptions     Medication Sig Dispense Auth. Provider    predniSONE (DELTASONE) 20 MG tablet Take 2 tablets (40 mg total) by mouth daily with breakfast for 5 days. 10 tablet Mamye Bolds, Ivette Marks, PA-C      PDMP not reviewed this encounter.   Kiamesha Samet, Ivette Marks, [place] 06/27/23 1249

## 2023-06-27 NOTE — Discharge Instructions (Addendum)
 The steroid injection should help to work in about 30 minutes to reduce pain and inflammation.  Starting tomorrow morning, take the prednisone as directed.  2 tablets with breakfast for 5 days in a row. You can safely use Tylenol.  I do not recommend any ibuprofen/Advil, naproxen/Aleve or other NSAIDs.  Please follow-up with your primary care provider regarding symptoms

## 2023-06-27 NOTE — ED Triage Notes (Signed)
 Patient reports that he has had mid back pain that radiates down both legs x 1 week. Patient states he was moving and lifting boxes last week.  Patient has been taking Ibuprofen and Menthol rub with no relief.

## 2023-07-12 ENCOUNTER — Encounter (HOSPITAL_COMMUNITY): Payer: Self-pay

## 2023-07-12 ENCOUNTER — Ambulatory Visit (HOSPITAL_COMMUNITY): Admission: EM | Admit: 2023-07-12 | Discharge: 2023-07-12 | Disposition: A | Discharge status: Home / Self Care

## 2023-07-12 DIAGNOSIS — L03031 Cellulitis of right toe: Secondary | ICD-10-CM | POA: Diagnosis not present

## 2023-07-12 MED ORDER — DOXYCYCLINE HYCLATE 100 MG PO CAPS: 100.0000 mg | ORAL_CAPSULE | Freq: Two times a day (BID) | ORAL | 0 refills | Status: AC

## 2023-07-12 NOTE — ED Triage Notes (Signed)
 Patient reports that he had an ingrown nail to the right great toe and was picking at it x 5 days.  It is now red and swollen.   Patient states he has been soaking his toe in epson salt and H2O2.

## 2023-07-12 NOTE — ED Provider Notes (Signed)
 [facility]  Note:  This document was prepared using Dragon voice recognition software and may include unintentional dictation errors.  MRN: [id number] DOB: Jun 23, 2000  Subjective:   Clinton Jones is a 23 y.o. male presenting for swelling, redness, pain to the right great toe at the lateral border of the toenail.  Patient reports for about 5 days he has been picking at what he thought was an ingrown toenail and now is having redness and pain.  Patient was concerned for secondary infection.  Patient reports he has been soaking his toe in Epsom salts and peroxide with mild relief.  Patient has not taken any over-the-counter pain medication.  No current facility-administered medications for this encounter.  Current Outpatient Medications:    doxycycline  (VIBRAMYCIN ) 100 MG capsule, Take 1 capsule (100 mg total) by mouth 2 (two) times daily for 5 days., Disp: 10 capsule, Rfl: 0   VRAYLAR 3 MG capsule, Take 3 mg by mouth at bedtime., Disp: , Rfl:    Allergies  Allergen Reactions   Morphine And Codeine Hives, Nausea And Vomiting and Rash    Past Medical History:  Diagnosis Date   Asthma    Chronic kidney disease    Depression    Family history of breast cancer    Headache(784.0)    IBS (irritable bowel syndrome)    IgA nephropathy with nephrotic syndrome      Past Surgical History:  Procedure Laterality Date   ADENOIDECTOMY Bilateral 02/23/2002   CIRCUMCISION  2003   COLONOSCOPY WITH ESOPHAGOGASTRODUODENOSCOPY (EGD)     In [place] at least 5 years ago   TYMPANOSTOMY Bilateral 02/23/2002    Family History  Problem Relation Age of Onset   Migraines Mother    Allergic rhinitis Mother    Allergic rhinitis Father    Hypertension Father    Nephrotic syndrome Father    Depression Father    Allergic rhinitis Brother    Asthma Brother    Breast cancer Maternal Aunt        dx. 45s-50s, mother's pat half sister   Hypertension Maternal Grandmother    Heart  attack Maternal Grandmother    Pancreatic cancer Maternal Grandfather 68   Hypertension Maternal Grandfather    Heart attack Maternal Grandfather    Depression Paternal Grandfather    Breast cancer Maternal Great-grandmother        MGF's mother; dx in her 56s   Breast cancer Other        dx. 100s, MGMs sister   Pancreatic cancer Other        d. 19s, MGMs brother   Breast cancer Cousin        Mother's maternal first cousin; dx in her 105s   Colon cancer Neg Hx    Esophageal cancer Neg Hx     Social History   Tobacco Use   Smoking status: Every Day    Types: E-cigarettes    Start date: 01/14/2017   Smokeless tobacco: Never   Tobacco comments:    Vape everyday   Vaping Use   Vaping status: Every Day   Substances: Nicotine , Flavoring  Substance Use Topics   Alcohol use: Yes   Drug use: Yes    Types: Marijuana    Comment: last used 06/28/2022    ROS Refer to HPI for ROS details.  Objective:   Vitals: BP 123/87 (BP Location: Left Arm)   Pulse 72   Temp 98.2 F (36.8 C) (Oral)   Resp 14   SpO2  98%   Physical Exam Vitals and nursing note reviewed.  Constitutional:      General: He is not in acute distress.    Appearance: Normal appearance. He is well-developed. He is not ill-appearing or toxic-appearing.  HENT:     Head: Normocephalic.  Cardiovascular:     Rate and Rhythm: Normal rate.  Pulmonary:     Effort: Pulmonary effort is normal. No respiratory distress.  Musculoskeletal:     Right foot: Normal range of motion. No deformity or bunion.       Feet:  Feet:     Right foot:     Skin integrity: Erythema and callus present. No ulcer, blister, skin breakdown or warmth.     Toenail Condition: Fungal disease present. Skin:    General: Skin is warm and dry.  Neurological:     General: No focal deficit present.     Mental Status: He is alert and oriented to person, place, and time.  Psychiatric:        Mood and Affect: Mood normal.        Behavior: Behavior  normal.     Procedures  No results found for this or any previous visit (from the past 24 hours).  No results found.   Assessment and Plan :     Discharge Instructions       1. Cellulitis of great toe, right (Primary) - doxycycline  (VIBRAMYCIN ) 100 MG capsule; Take 1 capsule (100 mg total) by mouth 2 (two) times daily for 5 days.  Dispense: 10 capsule; Refill: 0 - Apply over-the-counter antibiotic ointment to the area around cuticle to prevent further infection. - Take ibuprofen 600 mg or Tylenol 1000 mg as needed every 6-8 hrs for inflammation and pain to the right great toe  - Continue soaking toe in Epsom salt and warm water twice daily for 10 to 15 minutes at a time. - Do not cut toenails past the edge of the toe as this increases risk for ingrown toenail. -Continue to monitor symptoms for any change in severity if there is any escalation of current symptoms or development of new symptoms follow-up in ER for further evaluation and management.    Pranit Owensby B Danija Gosa   Reylene Stauder, [place], [place] 07/12/23 [phone]

## 2023-07-12 NOTE — Discharge Instructions (Addendum)
  1. Cellulitis of great toe, right (Primary) - doxycycline  (VIBRAMYCIN ) 100 MG capsule; Take 1 capsule (100 mg total) by mouth 2 (two) times daily for 5 days.  Dispense: 10 capsule; Refill: 0 - Apply over-the-counter antibiotic ointment to the area around cuticle to prevent further infection. - Take ibuprofen 600 mg or Tylenol 1000 mg as needed every 6-8 hrs for inflammation and pain to the right great toe  - Continue soaking toe in Epsom salt and warm water twice daily for 10 to 15 minutes at a time. - Do not cut toenails past the edge of the toe as this increases risk for ingrown toenail. -Continue to monitor symptoms for any change in severity if there is any escalation of current symptoms or development of new symptoms follow-up in ER for further evaluation and management.

## 2023-07-25 ENCOUNTER — Encounter (HOSPITAL_COMMUNITY): Payer: Self-pay | Admitting: *Deleted

## 2023-07-25 ENCOUNTER — Other Ambulatory Visit: Payer: Self-pay

## 2023-07-25 ENCOUNTER — Ambulatory Visit (HOSPITAL_COMMUNITY)
Admission: EM | Admit: 2023-07-25 | Discharge: 2023-07-25 | Disposition: A | Discharge status: Home / Self Care | Attending: Internal Medicine | Admitting: Internal Medicine

## 2023-07-25 DIAGNOSIS — L03031 Cellulitis of right toe: Secondary | ICD-10-CM | POA: Diagnosis not present

## 2023-07-25 DIAGNOSIS — L600 Ingrowing nail: Secondary | ICD-10-CM

## 2023-07-25 MED ORDER — MUPIROCIN 2 % EX OINT: 1.0000 | TOPICAL_OINTMENT | Freq: Two times a day (BID) | CUTANEOUS | 1 refills | Status: DC

## 2023-07-25 MED ORDER — BACITRACIN ZINC 500 UNIT/GM EX OINT
TOPICAL_OINTMENT | CUTANEOUS | Status: AC
  Filled 2023-07-25: qty 0.9

## 2023-07-25 MED ORDER — DOXYCYCLINE HYCLATE 100 MG PO CAPS: 100.0000 mg | ORAL_CAPSULE | Freq: Two times a day (BID) | ORAL | 0 refills | Status: DC

## 2023-07-25 NOTE — ED Provider Notes (Signed)
 [facility]    CSN: [id number] Arrival date & time: 07/25/23  1627      History   Chief Complaint Chief Complaint  Patient presents with   Toe Pain    HPI Clinton Jones is a 23 y.o. male.   23 year old male who presents urgent care with complaints of worsening right lateral great toe nail.  He was treated for an infected ingrown toenail with doxycycline  recently.  It did get better but then he went back to work and as soon as he did the area worsened again.  It has been draining and now has an open area with exposed tissue.  He denies any fevers or chills.  He has been keeping the area covered with a Band-Aid.  The area is very tender.   Toe Pain Pertinent negatives include no chest pain, no abdominal pain and no shortness of breath.    Past Medical History:  Diagnosis Date   Asthma    Chronic kidney disease    Depression    Family history of breast cancer    Headache(784.0)    IBS (irritable bowel syndrome)    IgA nephropathy with nephrotic syndrome     Patient Active Problem List   Diagnosis Date Noted   Vitamin D  deficiency 02/10/2023   Recurrent major depressive disorder, in partial remission (HCC) 02/10/2023   Mixed hyperlipidemia 02/10/2023   Genetic testing 01/06/2023   Family history of breast cancer    Bulimia nervosa 07/10/2019   Mild recurrent major depression (HCC) 06/27/2019   Cannabis use disorder, moderate, dependence (HCC) 06/27/2019   Nicotine  dependence due to vaping tobacco product 06/27/2019   Abnormal weight loss 05/02/2019   Recurrent oral ulcers 10/27/2018   IgA nephropathy 08/23/2018   Abdominal pain, chronic, generalized 10/11/2017   Irritable bowel syndrome with constipation and diarrhea 08/03/2016   Migraine without aura and without status migrainosus, not intractable 03/02/2014   Tension headache 09/13/2012   Migraine with aura 09/06/2012    Past Surgical History:  Procedure Laterality Date   ADENOIDECTOMY Bilateral  02/23/2002   CIRCUMCISION  2003   COLONOSCOPY WITH ESOPHAGOGASTRODUODENOSCOPY (EGD)     In [place] at least 5 years ago   TYMPANOSTOMY Bilateral 02/23/2002       Home Medications    Prior to Admission medications   Medication Sig Start Date End Date Taking? Authorizing Provider  doxycycline  (VIBRAMYCIN ) 100 MG capsule Take 1 capsule (100 mg total) by mouth 2 (two) times daily. 07/25/23  Yes Ason Heslin A, PA-C  mupirocin ointment (BACTROBAN) 2 % Apply 1 Application topically 2 (two) times daily. 07/25/23  Yes Lubna Stegeman A, PA-C  VRAYLAR 3 MG capsule Take 3 mg by mouth at bedtime. 02/03/23   [provider]    Family History Family History  Problem Relation Age of Onset   Migraines Mother    Allergic rhinitis Mother    Allergic rhinitis Father    Hypertension Father    Nephrotic syndrome Father    Depression Father    Allergic rhinitis Brother    Asthma Brother    Breast cancer Maternal Aunt        dx. 42s-50s, mother's pat half sister   Hypertension Maternal Grandmother    Heart attack Maternal Grandmother    Pancreatic cancer Maternal Grandfather 68   Hypertension Maternal Grandfather    Heart attack Maternal Grandfather    Depression Paternal Grandfather    Breast cancer Maternal Great-grandmother  MGF's mother; dx in her 65s   Breast cancer Other        dx. 30s, MGMs sister   Pancreatic cancer Other        d. 74s, MGMs brother   Breast cancer Cousin        Mother's maternal first cousin; dx in her 81s   Colon cancer Neg Hx    Esophageal cancer Neg Hx     Social History Social History   Tobacco Use   Smoking status: Every Day    Types: E-cigarettes    Start date: 01/14/2017   Smokeless tobacco: Never   Tobacco comments:    Vape everyday   Vaping Use   Vaping status: Every Day   Substances: Nicotine , Flavoring  Substance Use Topics   Alcohol use: Yes   Drug use: Yes    Types: Marijuana    Comment: last used 06/28/2022      Allergies   Morphine and codeine   Review of Systems Review of Systems  Constitutional:  Negative for chills and fever.  HENT:  Negative for ear pain and sore throat.   Eyes:  Negative for pain and visual disturbance.  Respiratory:  Negative for cough and shortness of breath.   Cardiovascular:  Negative for chest pain and palpitations.  Gastrointestinal:  Negative for abdominal pain and vomiting.  Genitourinary:  Negative for dysuria and hematuria.  Musculoskeletal:  Negative for arthralgias and back pain.  Skin:  Positive for wound. Negative for color change and rash.  Neurological:  Negative for seizures and syncope.  All other systems reviewed and are negative.    Physical Exam Triage Vital Signs ED Triage Vitals  Encounter Vitals Group     BP 07/25/23 1710 108/73     Systolic BP Percentile --      Diastolic BP Percentile --      Pulse Rate 07/25/23 1710 68     Resp 07/25/23 1710 18     Temp 07/25/23 1710 98.5 F (36.9 C)     Temp src --      SpO2 07/25/23 1710 96 %     Weight --      Height --      Head Circumference --      Peak Flow --      Pain Score 07/25/23 1707 9     Pain Loc --      Pain Education --      Exclude from Growth Chart --    No data found.  Updated Vital Signs BP 108/73   Pulse 68   Temp 98.5 F (36.9 C)   Resp 18   SpO2 96%   Visual Acuity Right Eye Distance:   Left Eye Distance:   Bilateral Distance:    Right Eye Near:   Left Eye Near:    Bilateral Near:     Physical Exam Vitals and nursing note reviewed.  Constitutional:      General: He is not in acute distress.    Appearance: He is well-developed.  HENT:     Head: Normocephalic and atraumatic.  Eyes:     Conjunctiva/sclera: Conjunctivae normal.  Cardiovascular:     Rate and Rhythm: Normal rate and regular rhythm.     Heart sounds: No murmur heard. Pulmonary:     Effort: Pulmonary effort is normal. No respiratory distress.     Breath sounds: Normal breath  sounds.  Abdominal:     Palpations: Abdomen is soft.  Tenderness: There is no abdominal tenderness.  Musculoskeletal:        General: No swelling.     Cervical back: Neck supple.       Feet:  Feet:     Comments: Palpable pedal pulses with brisk capillary refill Skin:    General: Skin is warm and dry.     Capillary Refill: Capillary refill takes less than 2 seconds.  Neurological:     Mental Status: He is alert.  Psychiatric:        Mood and Affect: Mood normal.      UC Treatments / Results  Labs (all labs ordered are listed, but only abnormal results are displayed) Labs Reviewed - No data to display  EKG   Radiology No results found.  Procedures Procedures (including critical care time)  Medications Ordered in UC Medications - No data to display  Initial Impression / Assessment and Plan / UC Course  I have reviewed the triage vital signs and the nursing notes.  Pertinent labs & imaging results that were available during my care of the patient were reviewed by me and considered in my medical decision making (see chart for details).     Cellulitis of great toe of right foot  Ingrowing nail, right great toe   Infected ingrown nail of the right great toe.  This area needs more advanced intervention by podiatry.  We will start doxycycline  100 mg twice daily for 10 days and mupirocin ointment to the open area twice daily.  Call podiatry first thing tomorrow to schedule an appointment to be seen.  Avoid prolonged standing or walking for the next 2 to 3 days.  Change the dressing twice daily with the mupirocin.  Okay to wash the area with soap and water.  Final Clinical Impressions(s) / UC Diagnoses   Final diagnoses:  Cellulitis of great toe of right foot  Ingrowing nail, right great toe     Discharge Instructions      Infected ingrown nail of the right great toe.  This area needs more advanced intervention by podiatry.  We will start doxycycline  100 mg  twice daily for 10 days and mupirocin ointment to the open area twice daily.  Call podiatry first thing tomorrow to schedule an appointment to be seen.  Avoid prolonged standing or walking for the next 2 to 3 days.  Change the dressing twice daily with the mupirocin.  Okay to wash the area with soap and water.  ED Prescriptions     Medication Sig Dispense Auth. Provider   doxycycline  (VIBRAMYCIN ) 100 MG capsule Take 1 capsule (100 mg total) by mouth 2 (two) times daily. 20 capsule Juno Bozard A, PA-C   mupirocin ointment (BACTROBAN) 2 % Apply 1 Application topically 2 (two) times daily. 22 g Kreg Pesa, [place]      PDMP not reviewed this encounter.   Kreg Pesa, [place] 07/25/23 1759

## 2023-07-25 NOTE — ED Triage Notes (Signed)
 PT reports removing a ingrown toe nail at home and now the toe is infected.

## 2023-07-25 NOTE — Discharge Instructions (Addendum)
 Infected ingrown nail of the right great toe.  This area needs more advanced intervention by podiatry.  We will start doxycycline  100 mg twice daily for 10 days and mupirocin ointment to the open area twice daily.  Call podiatry first thing tomorrow to schedule an appointment to be seen.  Avoid prolonged standing or walking for the next 2 to 3 days.  Change the dressing twice daily with the mupirocin.  Okay to wash the area with soap and water.

## 2023-07-29 ENCOUNTER — Ambulatory Visit: Admitting: Podiatry

## 2023-07-29 ENCOUNTER — Encounter: Payer: Self-pay | Admitting: Podiatry

## 2023-07-29 DIAGNOSIS — L600 Ingrowing nail: Secondary | ICD-10-CM

## 2023-07-29 NOTE — Progress Notes (Signed)
  Subjective:  Patient ID: Clinton Jones, male    DOB: Apr 18, 2000,  MRN: [id number]  Chief Complaint  Patient presents with   Ingrown Toenail    RM#11 Right foot great toe ingrown nail.    Discussed the use of AI scribe software for clinical note transcription with the patient, who gave verbal consent to proceed.  History of Present Illness Clinton Jones is a 23 year old male who presents with a toenail problem in his right big toe.  For approximately one month, he has experienced tenderness and pain in the right big toe, with drainage and pus under a scab. Antibiotics have reduced pain while walking, but tenderness persists. He has been applying antibiotic ointment and attempted self-treatment, suspecting an ingrown toenail. He is allergic to morphine.      Objective:    Physical Exam General: AAO x3, NAD  Dermatological: Incurvation present to right hallux toenail with granulation tissue present scabbing present on the nail border with localized edema and erythema without any ascending cellulitis.  There is no drainage or pus identified this time.  No open lesions otherwise.  Vascular: Dorsalis Pedis artery and Posterior Tibial artery pedal pulses are 2/4 bilateral with immedate capillary fill time.  There is no pain with calf compression, swelling, warmth, erythema.   Neruologic: Grossly intact via light touch bilateral.   Musculoskeletal: Tenderness on the ingrown toenail.  No other areas of discomfort.  Gait: Unassisted, Nonantalgic.     No images are attached to the encounter.    Results    Assessment:   1. Ingrown toenail      Plan:  Patient was evaluated and treated and all questions answered.  Assessment and Plan Assessment & Plan Ingrown toenail - At this time, the patient is requesting partial nail removal with chemical matricectomy to the symptomatic portion of the nail. Risks and complications were discussed with the patient for which they understand  and written consent was obtained. Under sterile conditions a total of 3 mL of a mixture of 2% lidocaine plain and 0.5% Marcaine plain was infiltrated in a hallux block fashion. Once anesthetized, the skin was prepped in sterile fashion. A tourniquet was then applied. Next the symptomatic border of hallux nail border was then sharply excised making sure to remove the entire offending nail border. Once the nails were ensured to be removed area was debrided and the underlying skin was intact. There is no purulence identified in the procedure. Next phenol was then applied under standard conditions and copiously irrigated.  Silvadene was applied. A dry sterile dressing was applied. After application of the dressing the tourniquet was removed and there is found to be an immediate capillary refill time to the digit. The patient tolerated the procedure well any complications. Post procedure instructions were discussed the patient for which he verbally understood. Discussed signs/symptoms of infection and directed to call the office immediately should any occur or go directly to the emergency room. In the meantime, encouraged to call the office with any questions, concerns, changes symptoms. - Finish course of antibiotics    Return in about 2 weeks (around 08/12/2023) for nail check.   Charity Conch DPM

## 2023-07-29 NOTE — Patient Instructions (Signed)

## 2023-09-30 IMAGING — CR DG SHOULDER 2+V*L*
3 series · 3 of 3 positions shown · non-contrast
Comparison: None.

CLINICAL DATA: Left shoulder pain for several days, initial
encounter

EXAM:
LEFT SHOULDER - 2+ VIEW

[w shoulder grashey left]
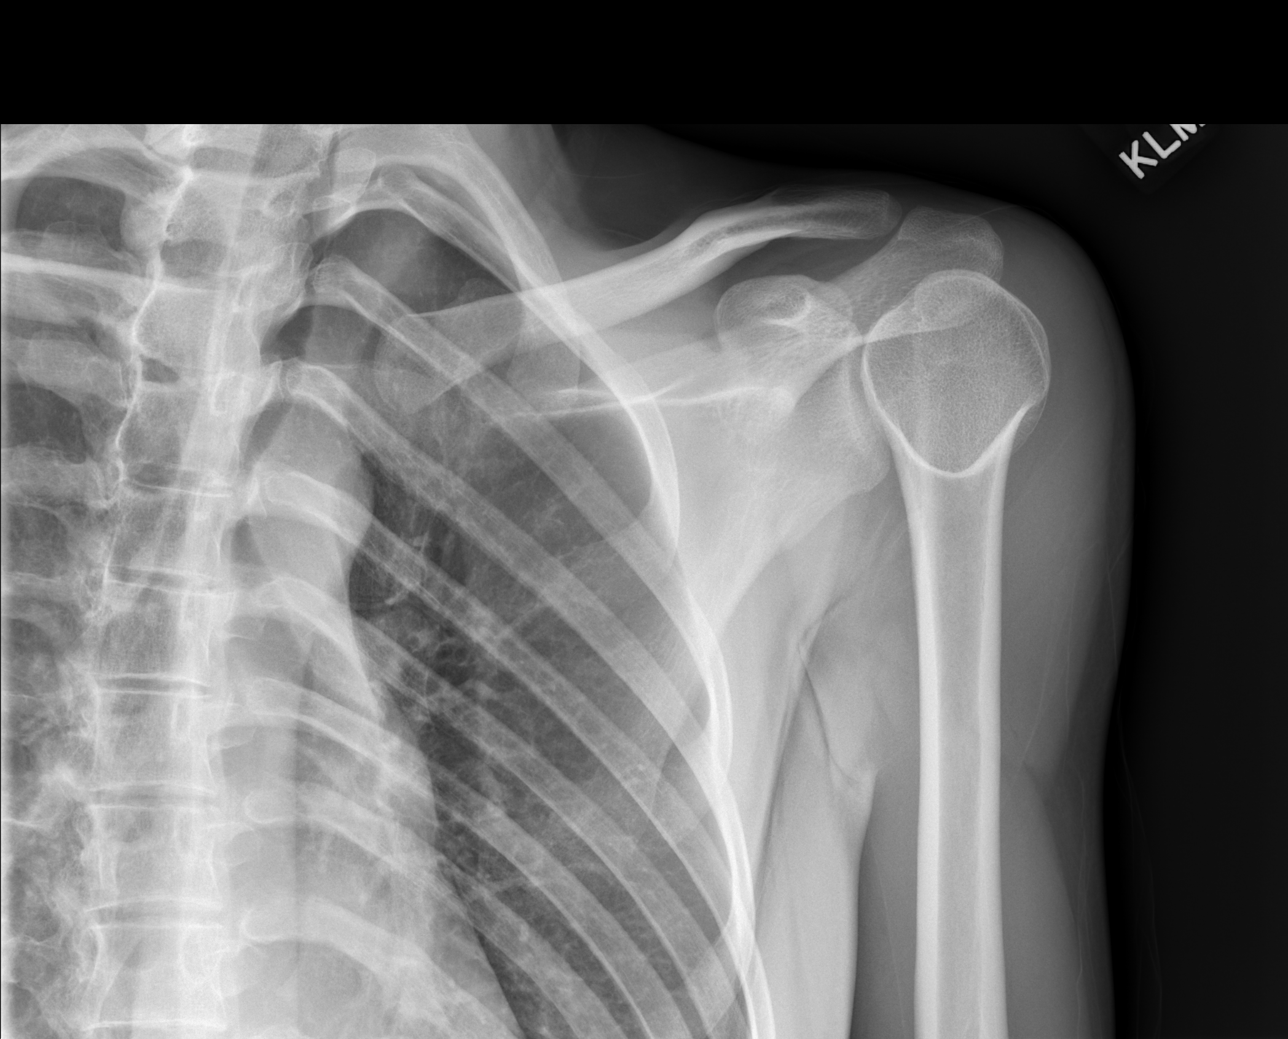

[w shoulder y-view left]
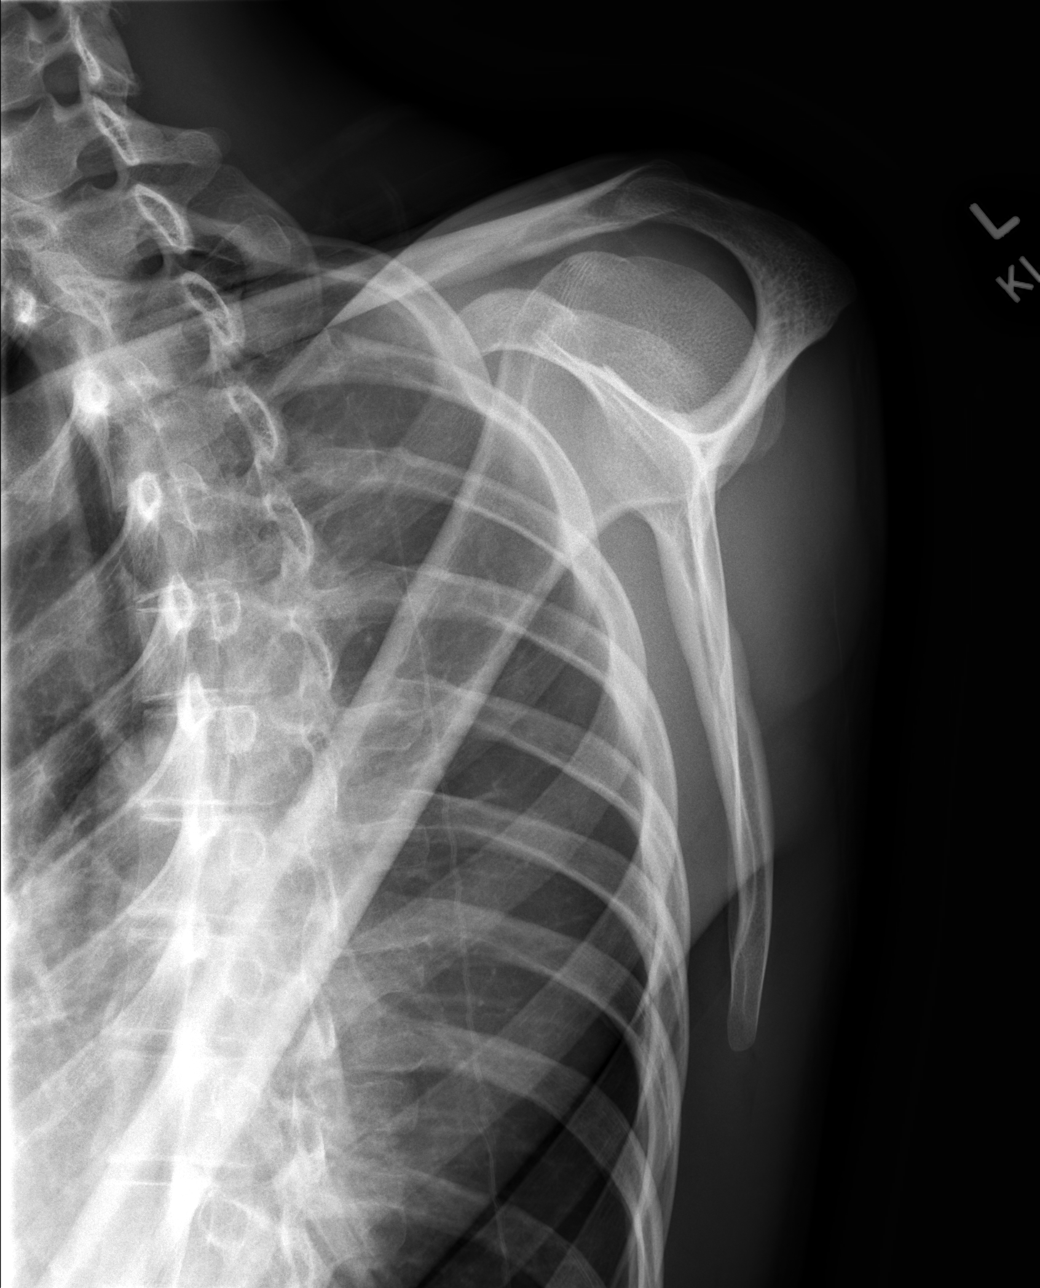

[x shoulder axillary left]
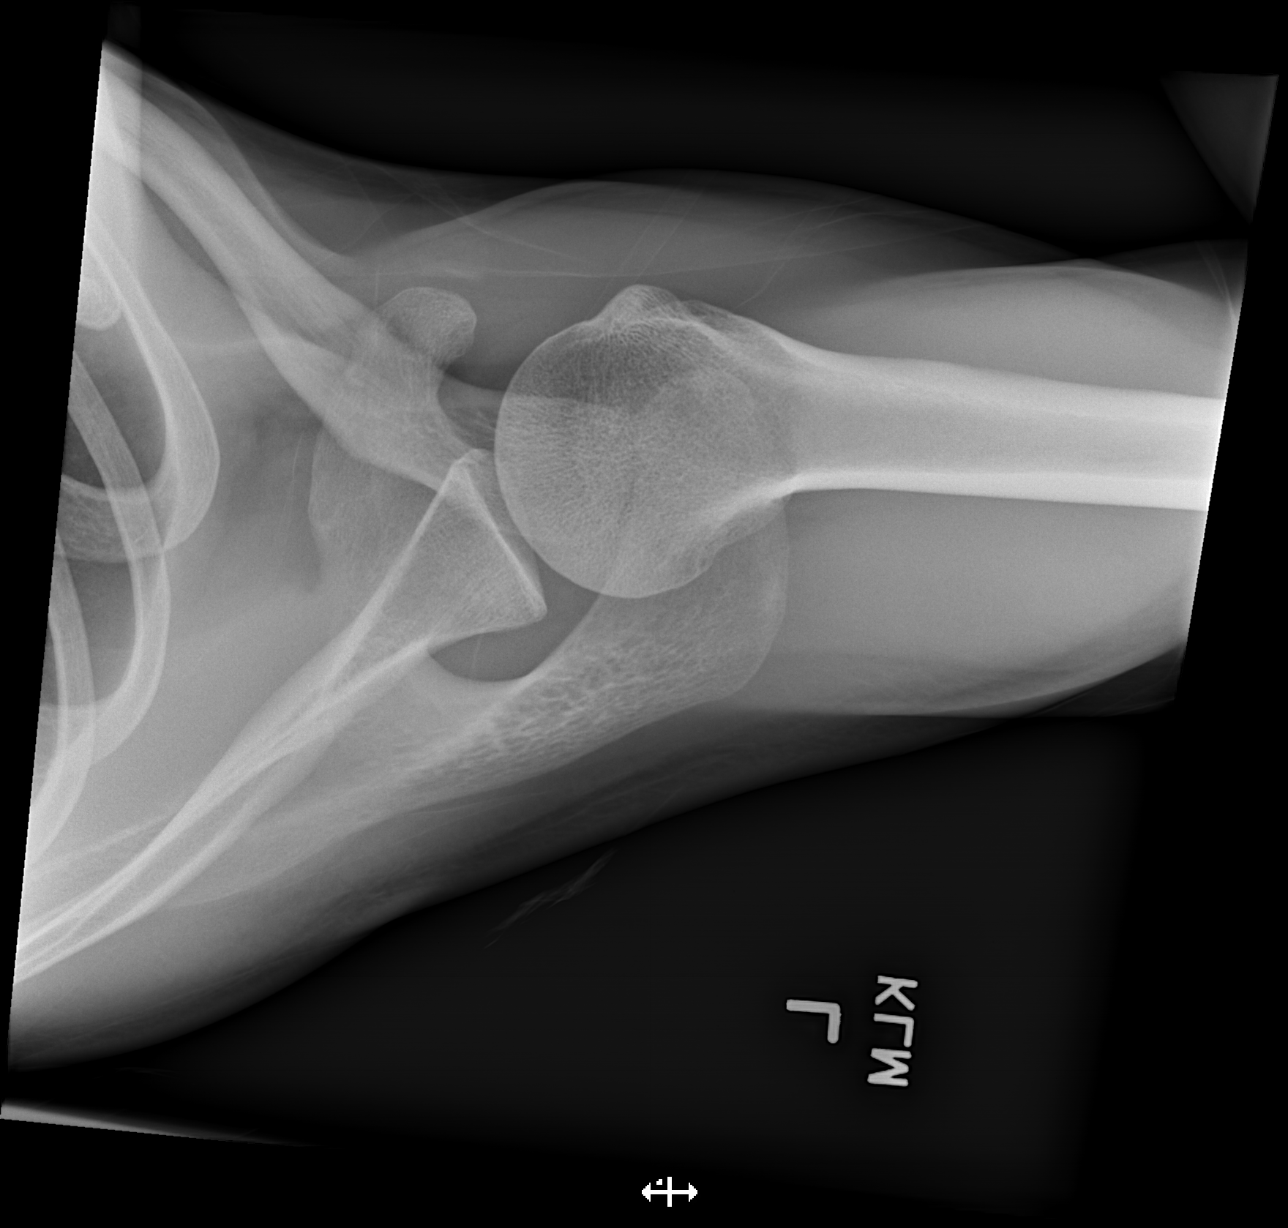

[3 of 3 positions shown; findings below may reference images not displayed]

FINDINGS: There is no evidence of fracture or dislocation. There is no
evidence of arthropathy or other focal bone abnormality. Soft
tissues are unremarkable.
IMPRESSION: No acute abnormality noted.

## 2023-11-17 ENCOUNTER — Other Ambulatory Visit: Payer: Self-pay

## 2023-11-17 ENCOUNTER — Ambulatory Visit
Admission: EM | Admit: 2023-11-17 | Discharge: 2023-11-17 | Disposition: A | Discharge status: Home / Self Care | Attending: Physician Assistant | Admitting: Physician Assistant

## 2023-11-17 DIAGNOSIS — J029 Acute pharyngitis, unspecified: Secondary | ICD-10-CM | POA: Diagnosis not present

## 2023-11-17 DIAGNOSIS — R0982 Postnasal drip: Secondary | ICD-10-CM

## 2023-11-17 LAB — POC SOFIA SARS ANTIGEN FIA: SARS Coronavirus 2 Ag: NEGATIVE

## 2023-11-17 LAB — POCT RAPID STREP A (OFFICE): Rapid Strep A Screen: NEGATIVE

## 2023-11-17 NOTE — ED Triage Notes (Addendum)
 Pt presents with a chief complaint of sore throat x 1 day. Currently rates overall pain a 1/10. Denies taking OTC medications PTA. Denies fevers at home. Pt voices concerns as he just got home from a cruise on Saturday, 9/20. Throat feels swollen at this time.

## 2023-11-17 NOTE — Discharge Instructions (Addendum)
 VISIT SUMMARY:  You came in today with a sore throat and ear pain. Your sore throat started this morning and feels swollen, but you do not have a fever, chills, nasal congestion, runny nose, or cough. You also mentioned a sensation of drainage down the back of your throat and slight difficulty swallowing. Your ear pain started last night but has since subsided. You have not taken any medication for the pain or discomfort and have no recent exposure to individuals with similar symptoms. You recently returned from a cruise.  YOUR PLAN:  -ACUTE PHARYNGITIS WITH POSTNASAL DRIP: Acute pharyngitis is an inflammation of the throat, often causing pain and swelling. Your symptoms are likely due to postnasal drip, which is when excess mucus from the nose drains down the back of the throat, causing irritation. You tested negative for COVID and strep, and there is no significant swelling or white patches in your throat. I recommend taking an antihistamine like Zyrtec or Claritin and using Flonase. Please return if your symptoms worsen or if you develop coughing, trouble breathing, fever, or chills.  INSTRUCTIONS:  Please take an antihistamine such as Zyrtec or Claritin and use Flonase as directed. Return to the clinic if your symptoms worsen or if you develop coughing, trouble breathing, fever, or chills.

## 2023-11-17 NOTE — ED Provider Notes (Signed)
 [facility]    CSN: [id number] Arrival date & time: 11/17/23  1855      History   Chief Complaint Chief Complaint  Patient presents with   Sore Throat    HPI Clinton Jones is a 23 y.o. male.  has a past medical history of Asthma, Chronic kidney disease, Depression, Family history of breast cancer, Headache(784.0), IBS (irritable bowel syndrome), and IgA nephropathy with nephrotic syndrome.   HPI Discussed the use of AI scribe software for clinical note transcription with the patient, who gave verbal consent to proceed.  The patient presents with a sore throat and ear pain.  The sore throat began this morning, initially painful and now feeling swollen. They have no fever, chills, nasal congestion, runny nose, or cough. They mention a sensation of drainage down the back of the throat, though not as severe as previous experiences. They describe slight difficulty swallowing due to the throat swelling sensation.  Ear pain started last night, waking them from sleep, but subsided by the next time they awoke. No ear pain at the time of the visit.  They have not taken any medication for the pain or discomfort. No recent exposure to individuals with similar symptoms and no significant history of seasonal allergies. They recently returned from a cruise to [place] and [place], [place], and the [place] on Saturday.  Nausea and vomiting are regular occurrences for them, but not related to the current illness.   Past Medical History:  Diagnosis Date   Asthma    Chronic kidney disease    Depression    Family history of breast cancer    Headache(784.0)    IBS (irritable bowel syndrome)    IgA nephropathy with nephrotic syndrome     Patient Active Problem List   Diagnosis Date Noted   Vitamin D  deficiency 02/10/2023   Recurrent major depressive disorder, in partial remission 02/10/2023   Mixed hyperlipidemia 02/10/2023   Genetic testing 01/06/2023   Family history of breast  cancer    Bulimia nervosa 07/10/2019   Mild recurrent major depression 06/27/2019   Cannabis use disorder, moderate, dependence (HCC) 06/27/2019   Nicotine  dependence due to vaping tobacco product 06/27/2019   Abnormal weight loss 05/02/2019   Recurrent oral ulcers 10/27/2018   IgA nephropathy 08/23/2018   Abdominal pain, chronic, generalized 10/11/2017   Irritable bowel syndrome with constipation and diarrhea 08/03/2016   Migraine without aura and without status migrainosus, not intractable 03/02/2014   Tension headache 09/13/2012   Migraine with aura 09/06/2012    Past Surgical History:  Procedure Laterality Date   ADENOIDECTOMY Bilateral 02/23/2002   CIRCUMCISION  2003   COLONOSCOPY WITH ESOPHAGOGASTRODUODENOSCOPY (EGD)     In [place] at least 5 years ago   TYMPANOSTOMY Bilateral 02/23/2002       Home Medications    Prior to Admission medications   Medication Sig Start Date End Date Taking? Authorizing Provider  doxycycline  (VIBRAMYCIN ) 100 MG capsule Take 1 capsule (100 mg total) by mouth 2 (two) times daily. 07/25/23   White, Elizabeth A, PA-C  mupirocin  ointment (BACTROBAN ) 2 % Apply 1 Application topically 2 (two) times daily. 07/25/23   White, Elizabeth A, PA-C  VRAYLAR 3 MG capsule Take 3 mg by mouth at bedtime. 02/03/23   [provider]    Family History Family History  Problem Relation Age of Onset   Migraines Mother    Allergic rhinitis Mother    Allergic rhinitis Father    Hypertension Father  Nephrotic syndrome Father    Depression Father    Allergic rhinitis Brother    Asthma Brother    Breast cancer Maternal Aunt        dx. 71s-50s, mother's pat half sister   Hypertension Maternal Grandmother    Heart attack Maternal Grandmother    Pancreatic cancer Maternal Grandfather 60   Hypertension Maternal Grandfather    Heart attack Maternal Grandfather    Depression Paternal Grandfather    Breast cancer Maternal Great-grandmother         MGF's mother; dx in her 23s   Breast cancer Other        dx. 37s, MGMs sister   Pancreatic cancer Other        d. 64s, MGMs brother   Breast cancer Cousin        Mother's maternal first cousin; dx in her 42s   Colon cancer Neg Hx    Esophageal cancer Neg Hx     Social History Social History   Tobacco Use   Smoking status: Every Day    Types: E-cigarettes    Start date: 01/14/2017   Smokeless tobacco: Never   Tobacco comments:    Vape everyday   Vaping Use   Vaping status: Every Day   Substances: Nicotine , Flavoring  Substance Use Topics   Alcohol use: Yes   Drug use: Yes    Types: Marijuana    Comment: last used 06/28/2022     Allergies   Morphine and codeine   Review of Systems Review of Systems  Constitutional:  Negative for chills and fever.  HENT:  Positive for ear pain, postnasal drip and sore throat. Negative for congestion, rhinorrhea and trouble swallowing.   Respiratory:  Negative for cough, shortness of breath and wheezing.   Gastrointestinal:  Negative for diarrhea, nausea and vomiting.     Physical Exam Triage Vital Signs ED Triage Vitals  Encounter Vitals Group     BP 11/17/23 1948 116/81     Girls Systolic BP Percentile --      Girls Diastolic BP Percentile --      Boys Systolic BP Percentile --      Boys Diastolic BP Percentile --      Pulse Rate 11/17/23 1948 69     Resp 11/17/23 1948 16     Temp 11/17/23 1948 98.5 F (36.9 C)     Temp Source 11/17/23 1948 Oral     SpO2 11/17/23 1948 98 %     Weight 11/17/23 1948 125 lb (56.7 kg)     Height 11/17/23 1948 5' 8 (1.727 m)     Head Circumference --      Peak Flow --      Pain Score 11/17/23 2002 1     Pain Loc --      Pain Education --      Exclude from Growth Chart --    No data found.  Updated Vital Signs BP 116/81 (BP Location: Right Arm)   Pulse 69   Temp 98.5 F (36.9 C) (Oral)   Resp 16   Ht 5' 8 (1.727 m)   Wt 125 lb (56.7 kg)   SpO2 98%   BMI 19.01 kg/m   Visual  Acuity Right Eye Distance:   Left Eye Distance:   Bilateral Distance:    Right Eye Near:   Left Eye Near:    Bilateral Near:     Physical Exam Vitals reviewed.  Constitutional:      General:  He is awake.     Appearance: Normal appearance. He is well-developed and well-groomed.  HENT:     Head: Normocephalic and atraumatic.     Right Ear: Hearing, tympanic membrane and ear canal normal.     Left Ear: Hearing, tympanic membrane and ear canal normal.     Mouth/Throat:     Lips: Pink.     Mouth: Mucous membranes are moist.     Pharynx: Oropharynx is clear. Uvula midline. Posterior oropharyngeal erythema and postnasal drip present. No pharyngeal swelling, oropharyngeal exudate or uvula swelling.     Tonsils: No tonsillar exudate or tonsillar abscesses. 0 on the right. 0 on the left.  Eyes:     General: Lids are normal. Gaze aligned appropriately.     Extraocular Movements: Extraocular movements intact.  Cardiovascular:     Rate and Rhythm: Normal rate and regular rhythm.     Heart sounds: Normal heart sounds.  Pulmonary:     Effort: Pulmonary effort is normal.     Breath sounds: Normal breath sounds. No decreased air movement. No decreased breath sounds, wheezing, rhonchi or rales.  Musculoskeletal:     Cervical back: Normal range of motion and neck supple.  Lymphadenopathy:     Head:     Right side of head: No submental, submandibular or preauricular adenopathy.     Left side of head: No submental, submandibular or preauricular adenopathy.     Cervical:     Right cervical: No superficial cervical adenopathy.    Left cervical: No superficial cervical adenopathy.     Upper Body:     Right upper body: No supraclavicular adenopathy.     Left upper body: No supraclavicular adenopathy.  Skin:    General: Skin is warm and dry.  Neurological:     General: No focal deficit present.     Mental Status: He is alert and oriented to person, place, and time.  Psychiatric:        Mood  and Affect: Mood normal.        Behavior: Behavior normal. Behavior is cooperative.        Thought Content: Thought content normal.        Judgment: Judgment normal.      UC Treatments / Results  Labs (all labs ordered are listed, but only abnormal results are displayed) Labs Reviewed  POC SOFIA SARS ANTIGEN FIA  POCT RAPID STREP A (OFFICE)    EKG   Radiology No results found.  Procedures Procedures (including critical care time)  Medications Ordered in UC Medications - No data to display  Initial Impression / Assessment and Plan / UC Course  I have reviewed the triage vital signs and the nursing notes.  Pertinent labs & imaging results that were available during my care of the patient were reviewed by me and considered in my medical decision making (see chart for details).      Final Clinical Impressions(s) / UC Diagnoses   Final diagnoses:  Sore throat  Postnasal drip   Acute pharyngitis with postnasal drip Acute onset of sore throat with sensation of swelling, likely due to postnasal drip. Negative for COVID and strep. No significant swelling or white patches. Symptoms likely due to postnasal drainage causing throat irritation and inflammation radiating to the ears. No fever, chills, or significant nasal congestion. Recent travel history which may be contributing to symptoms  - Recommend antihistamine such as Zyrtec or Claritin. - Advise use of Flonase. - Instruct to return if symptoms  worsen or if they develop coughing, trouble breathing, fever, or chills.   Discharge Instructions      VISIT SUMMARY:  You came in today with a sore throat and ear pain. Your sore throat started this morning and feels swollen, but you do not have a fever, chills, nasal congestion, runny nose, or cough. You also mentioned a sensation of drainage down the back of your throat and slight difficulty swallowing. Your ear pain started last night but has since subsided. You have not  taken any medication for the pain or discomfort and have no recent exposure to individuals with similar symptoms. You recently returned from a cruise.  YOUR PLAN:  -ACUTE PHARYNGITIS WITH POSTNASAL DRIP: Acute pharyngitis is an inflammation of the throat, often causing pain and swelling. Your symptoms are likely due to postnasal drip, which is when excess mucus from the nose drains down the back of the throat, causing irritation. You tested negative for COVID and strep, and there is no significant swelling or white patches in your throat. I recommend taking an antihistamine like Zyrtec or Claritin and using Flonase. Please return if your symptoms worsen or if you develop coughing, trouble breathing, fever, or chills.  INSTRUCTIONS:  Please take an antihistamine such as Zyrtec or Claritin and use Flonase as directed. Return to the clinic if your symptoms worsen or if you develop coughing, trouble breathing, fever, or chills.     ED Prescriptions   None    PDMP not reviewed this encounter.   Jailan Trimm E, PA-C 11/17/23 2055

## 2024-01-21 ENCOUNTER — Encounter (HOSPITAL_COMMUNITY): Payer: Self-pay | Admitting: Emergency Medicine

## 2024-01-21 ENCOUNTER — Ambulatory Visit (INDEPENDENT_AMBULATORY_CARE_PROVIDER_SITE_OTHER)

## 2024-01-21 ENCOUNTER — Ambulatory Visit (HOSPITAL_COMMUNITY): Payer: Self-pay | Admitting: Physician Assistant

## 2024-01-21 ENCOUNTER — Ambulatory Visit (HOSPITAL_COMMUNITY)
Admission: EM | Admit: 2024-01-21 | Discharge: 2024-01-21 | Disposition: A | Discharge status: Home / Self Care | Attending: Physician Assistant | Admitting: Physician Assistant

## 2024-01-21 DIAGNOSIS — M549 Dorsalgia, unspecified: Secondary | ICD-10-CM

## 2024-01-21 DIAGNOSIS — G8929 Other chronic pain: Secondary | ICD-10-CM

## 2024-01-21 DIAGNOSIS — M545 Low back pain, unspecified: Secondary | ICD-10-CM

## 2024-01-21 MED ORDER — METHOCARBAMOL 500 MG PO TABS: 500.0000 mg | ORAL_TABLET | Freq: Two times a day (BID) | ORAL | 0 refills | Status: AC

## 2024-01-21 MED ORDER — LIDOCAINE 5 % EX PTCH: 1.0000 | MEDICATED_PATCH | CUTANEOUS | 0 refills | Status: AC

## 2024-01-21 NOTE — ED Provider Notes (Signed)
 [facility]    CSN: [id number] Arrival date & time: 01/21/24  0901      History   Chief Complaint Chief Complaint  Patient presents with   Back Pain    HPI Tylen Leverich is a 23 y.o. male.   Patient presents today with a prolonged history (over a year) of intermittent lower back pain that has worsened significantly in the past several days.  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  He reports that pain is to at rest but increases to 8/9 with certain movements or randomly, localized to his midline lower back with radiation towards buttocks but not into the legs, no alleviating factors notified.  He did try muscle relaxer (does not know the name) that was ineffective.  He denies any bowel/bladder incontinence, lower extremity weakness, saddle anesthesia, fever, rash, nausea/vomiting, headache, vision change, neck pain.  Denies previous injury or surgery involving his back.  He is having difficulty with his daily duties as a result of the symptoms.    Past Medical History:  Diagnosis Date   Asthma    Chronic kidney disease    Depression    Family history of breast cancer    Headache(784.0)    IBS (irritable bowel syndrome)    IgA nephropathy with nephrotic syndrome     Patient Active Problem List   Diagnosis Date Noted   Vitamin D  deficiency 02/10/2023   Recurrent major depressive disorder, in partial remission 02/10/2023   Mixed hyperlipidemia 02/10/2023   Genetic testing 01/06/2023   Family history of breast cancer    Bulimia nervosa (HCC) 07/10/2019   Mild recurrent major depression 06/27/2019   Cannabis use disorder, moderate, dependence (HCC) 06/27/2019   Nicotine  dependence due to vaping tobacco product 06/27/2019   Abnormal weight loss 05/02/2019   Recurrent oral  ulcers 10/27/2018   IgA nephropathy 08/23/2018   Abdominal pain, chronic, generalized 10/11/2017   Irritable bowel syndrome with constipation and diarrhea 08/03/2016   Migraine without aura and without status migrainosus, not intractable 03/02/2014   Tension headache 09/13/2012   Migraine with aura 09/06/2012    Past Surgical History:  Procedure Laterality Date   ADENOIDECTOMY Bilateral 02/23/2002   CIRCUMCISION  2003   COLONOSCOPY WITH ESOPHAGOGASTRODUODENOSCOPY (EGD)     In [place] at least 5 years ago   TYMPANOSTOMY Bilateral 02/23/2002       Home Medications    Prior to Admission medications   Medication Sig Start Date End Date Taking? Authorizing Provider  lidocaine (LIDODERM) 5 % Place 1 patch onto the skin daily. Remove & Discard patch within 12 hours or as directed by MD 01/21/24  Yes Rossi Silvestro K, PA-C  methocarbamol  (ROBAXIN ) 500 MG tablet Take 1 tablet (500 mg total) by mouth 2 (two) times daily. 01/21/24  Yes Hayli Milligan K, PA-C  VRAYLAR 3 MG capsule Take 3 mg by mouth at bedtime. 02/03/23  Yes [provider]    Family History Family History  Problem Relation Age of Onset   Migraines Mother    Allergic rhinitis Mother    Allergic rhinitis Father    Hypertension Father    Nephrotic syndrome Father    Depression Father    Allergic rhinitis Brother    Asthma Brother    Breast cancer Maternal  Aunt        dx. 40s-50s, mother's pat half sister   Hypertension Maternal Grandmother    Heart attack Maternal Grandmother    Pancreatic cancer Maternal Grandfather 68   Hypertension Maternal Grandfather    Heart attack Maternal Grandfather    Depression Paternal Grandfather    Breast cancer Maternal Great-grandmother        MGF's mother; dx in her 84s   Breast cancer Other        dx. 35s, MGMs sister   Pancreatic cancer Other        d. 52s, MGMs brother   Breast cancer Cousin        Mother's maternal first cousin; dx in her 58s   Colon cancer  Neg Hx    Esophageal cancer Neg Hx     Social History Social History   Tobacco Use   Smoking status: Every Day    Types: E-cigarettes    Start date: 01/14/2017   Smokeless tobacco: Never   Tobacco comments:    Vape everyday   Vaping Use   Vaping status: Every Day   Substances: Nicotine , Flavoring  Substance Use Topics   Alcohol use: Yes   Drug use: Yes    Types: Marijuana    Comment: last used 06/28/2022     Allergies   Morphine and codeine   Review of Systems Review of Systems  Constitutional:  Positive for activity change. Negative for appetite change, fatigue and fever.  Gastrointestinal:  Negative for diarrhea, nausea and vomiting.  Genitourinary:  Negative for difficulty urinating and enuresis.  Musculoskeletal:  Positive for back pain. Negative for arthralgias and myalgias.  Skin:  Negative for color change and rash.  Neurological:  Negative for weakness and numbness.     Physical Exam Triage Vital Signs ED Triage Vitals  Encounter Vitals Group     BP 01/21/24 0931 120/75     Girls Systolic BP Percentile --      Girls Diastolic BP Percentile --      Boys Systolic BP Percentile --      Boys Diastolic BP Percentile --      Pulse Rate 01/21/24 0931 78     Resp 01/21/24 0931 18     Temp 01/21/24 0931 98.1 F (36.7 C)     Temp Source 01/21/24 0931 Oral     SpO2 01/21/24 0931 98 %     Weight 01/21/24 0930 125 lb (56.7 kg)     Height 01/21/24 0930 5' 8 (1.727 m)     Head Circumference --      Peak Flow --      Pain Score 01/21/24 0930 8     Pain Loc --      Pain Education --      Exclude from Growth Chart --    No data found.  Updated Vital Signs BP 120/75 (BP Location: Left Arm)   Pulse 78   Temp 98.1 F (36.7 C) (Oral)   Resp 18   Ht 5' 8 (1.727 m)   Wt 125 lb (56.7 kg)   SpO2 98%   BMI 19.01 kg/m   Visual Acuity Right Eye Distance:   Left Eye Distance:   Bilateral Distance:    Right Eye Near:   Left Eye Near:    Bilateral Near:      Physical Exam Vitals reviewed.  Constitutional:      General: He is awake.     Appearance: Normal appearance. He is well-developed.  He is not ill-appearing.     Comments: Very pleasant male appears stated age in no acute distress sitting comfortably in exam room  HENT:     Head: Normocephalic and atraumatic.  Cardiovascular:     Rate and Rhythm: Normal rate and regular rhythm.     Heart sounds: Normal heart sounds, S1 normal and S2 normal. No murmur heard. Pulmonary:     Effort: Pulmonary effort is normal.     Breath sounds: Normal breath sounds. No stridor. No wheezing, rhonchi or rales.     Comments: Clear to auscultation bilaterally Abdominal:     Palpations: Abdomen is soft.     Tenderness: There is no abdominal tenderness. There is no right CVA tenderness, left CVA tenderness, guarding or rebound.  Musculoskeletal:     Cervical back: No tenderness or bony tenderness.     Thoracic back: No tenderness or bony tenderness.     Lumbar back: Tenderness present. No bony tenderness. Positive right straight leg raise test. Negative left straight leg raise test.     Comments: Back: Mild tenderness to palpation over midline lumbar paraspinal muscles.  No pain percussion of vertebrae.  No deformity or step-off noted.  Positive straight leg raise on the right at approximately 15 degrees.  Strength 5/5 bilateral lower extremities.  Negative FABER bilaterally.  Neurological:     Mental Status: He is alert.  Psychiatric:        Behavior: Behavior is cooperative.      UC Treatments / Results  Labs (all labs ordered are listed, but only abnormal results are displayed) Labs Reviewed - No data to display  EKG   Radiology DG Lumbar Spine Complete Result Date: 01/21/2024 CLINICAL DATA:  Back pain. EXAM: LUMBAR SPINE - COMPLETE 4+ VIEW COMPARISON:  None Available. FINDINGS: There is no evidence of lumbar spine fracture. Alignment is normal. Intervertebral disc spaces are maintained.  IMPRESSION: Negative. Electronically Signed   By: Alm Parkins M.D.   On: 01/21/2024 11:38    Procedures Procedures (including critical care time)  Medications Ordered in UC Medications - No data to display  Initial Impression / Assessment and Plan / UC Course  I have reviewed the triage vital signs and the nursing notes.  Pertinent labs & imaging results that were available during my care of the patient were reviewed by me and considered in my medical decision making (see chart for details).     Patient is well-appearing, afebrile, nontoxic, nontachycardic.  X-ray of lumbar spine was obtained that showed degenerative changes at the inferior thoracic spine and superior lumbar spine with no acute osseous abnormality.  At the time discharged reporting radiologist over read we will contact him if this differs and changes our treatment plan.  I am concerned for slipped/bulging disc given positive straight leg raise and so recommended close follow-up with orthopedics; he was given the contact information for local provider with instruction to call to schedule appointment.  Discussed he may benefit from physical therapy and may also require an MRI in the future if his symptoms persist both of which we are unable to arrange in urgent care.  He does have a history of nephropathy and has been followed by nephrology to avoid NSAIDs so he was encouraged to use Tylenol over-the-counter to manage his pain.  He also has a history of anorexia/bulimia and so we will defer corticosteroids as this may trigger abnormal behaviors.  He was given lidocaine patches for symptom relief and also provided Robaxin .  We discussed that this medication can be sedating he does not drive drink alcohol taking it.  Indication for dose adjustment of Robaxin  based on metabolic panel from 02/11/2023 with creatinine of 0.92 and calculated creatinine clearance of 101 mL/min.  We discussed that if he has any worsening or changing symptoms  he needs to be seen immediately.  Strict return precautions given.  Excuse note provided.  Patient expressed understanding and agreement to treatment plan.  Final Clinical Impressions(s) / UC Diagnoses   Final diagnoses:  Acute midline low back pain without sciatica     Discharge Instructions      I did see some arthritis but nothing else out of place or abnormal on your x-ray.  I will contact you if the radiologist sees something else.  Please apply lidocaine patch during the day.  Remove this at night and use only 1 patch for 24 hours.  Take methocarbamol  to twice a day.  This will make you sleepy so do not drive or drink alcohol while taking it.  Take Tylenol over-the-counter for pain relief.  I would like you to follow-up with an orthopedic provider as they may be able to provide additional resources such as referral to physical therapy as well as potentially an MRI if you continue to have pain that we do not have access to an urgent care.  Call them to schedule an appointment.  If anything worsens you have increasing pain, fever, rash, going to the bathroom on yourself without noticing it, weakness in your legs you need to be seen immediately.     ED Prescriptions     Medication Sig Dispense Auth. Provider   lidocaine (LIDODERM) 5 % Place 1 patch onto the skin daily. Remove & Discard patch within 12 hours or as directed by MD 30 patch Tashara Suder K, PA-C   methocarbamol  (ROBAXIN ) 500 MG tablet Take 1 tablet (500 mg total) by mouth 2 (two) times daily. 20 tablet Melani Brisbane K, PA-C      PDMP not reviewed this encounter.   Sherrell Rocky POUR, PA-C 01/21/24 1141

## 2024-01-21 NOTE — Discharge Instructions (Signed)
 I did see some arthritis but nothing else out of place or abnormal on your x-ray.  I will contact you if the radiologist sees something else.  Please apply lidocaine patch during the day.  Remove this at night and use only 1 patch for 24 hours.  Take methocarbamol  to twice a day.  This will make you sleepy so do not drive or drink alcohol while taking it.  Take Tylenol over-the-counter for pain relief.  I would like you to follow-up with an orthopedic provider as they may be able to provide additional resources such as referral to physical therapy as well as potentially an MRI if you continue to have pain that we do not have access to an urgent care.  Call them to schedule an appointment.  If anything worsens you have increasing pain, fever, rash, going to the bathroom on yourself without noticing it, weakness in your legs you need to be seen immediately.

## 2024-01-21 NOTE — ED Triage Notes (Signed)
 Patient c/o low back pain more in the middle since yesterday.  Pain does radiate to his butt but not any lower.  No apparent injury.  Patient did take a muscle relaxer last night w/o any relief.

## 2024-02-15 ENCOUNTER — Encounter: Payer: Self-pay | Admitting: Nurse Practitioner
